# Patient Record
Sex: Female | Born: 1955 | Race: White | Hispanic: No | Marital: Married | Smoking: Never smoker
Health system: Southern US, Community
[De-identification: ages and names within clinical notes are randomized; demographics above are authoritative.]

## PROBLEM LIST (undated history)

## (undated) ENCOUNTER — Emergency Department (HOSPITAL_BASED_OUTPATIENT_CLINIC_OR_DEPARTMENT_OTHER): Discharge status: Against Medical Advice | Payer: BC Managed Care – PPO

## (undated) DIAGNOSIS — K5790 Diverticulosis of intestine, part unspecified, without perforation or abscess without bleeding: Secondary | ICD-10-CM

## (undated) DIAGNOSIS — E079 Disorder of thyroid, unspecified: Secondary | ICD-10-CM

## (undated) HISTORY — PX: ABDOMINAL HYSTERECTOMY: SHX81

## (undated) HISTORY — PX: OOPHORECTOMY: SHX86

## (undated) HISTORY — PX: APPENDECTOMY: SHX54

## (undated) HISTORY — PX: CHOLECYSTECTOMY: SHX55

---

## 2012-01-30 ENCOUNTER — Encounter: Payer: Self-pay | Admitting: *Deleted

## 2012-01-30 ENCOUNTER — Emergency Department
Admission: EM | Admit: 2012-01-30 | Discharge: 2012-01-30 | Disposition: A | Discharge status: Home / Self Care | Payer: BC Managed Care – PPO | Source: Home / Self Care | Attending: Emergency Medicine | Admitting: Emergency Medicine

## 2012-01-30 DIAGNOSIS — R21 Rash and other nonspecific skin eruption: Secondary | ICD-10-CM

## 2012-01-30 DIAGNOSIS — L299 Pruritus, unspecified: Secondary | ICD-10-CM

## 2012-01-30 HISTORY — DX: Disorder of thyroid, unspecified: E07.9

## 2012-01-30 MED ORDER — PREDNISONE 10 MG PO TABS: ORAL_TABLET | ORAL | Status: DC

## 2012-01-30 NOTE — ED Provider Notes (Signed)
History     CSN: [id number]  Arrival date & time 01/30/12  1515   First MD Initiated Contact with Patient 01/30/12 1536      Chief Complaint  Patient presents with  . Rash    (Consider location/radiation/quality/duration/timing/severity/associated sxs/prior treatment) HPI This patient complains of a RASH  Location: whole body  Onset: a few days ago    Course: unchanged Self-treated with: Benedryl topical and oral             Improvement with treatment: some  History Itching: yes  Tenderness: no  New medications/antibiotics: no, about a month ago her doctor changed her hormones and thyroid medicine  Pet exposure: no  Recent travel or tropical exposure: no  New soaps, shampoos, detergent, clothing: no  Tick/insect exposure: no   Red Flags Feeling ill: no  Fever: no  Facial/tongue swelling/difficulty breathing:  no (has a history of childhood asthma) Diabetic or immunocompromised: no     Past Medical History  Diagnosis Date  . Thyroid disease     Past Surgical History  Procedure Date  . Abdominal hysterectomy   . Appendectomy   . Cholecystectomy   . Oophorectomy     No family history on file.  History  Substance Use Topics  . Smoking status: Never Smoker   . Smokeless tobacco: Never Used  . Alcohol Use: No    OB History    Grav Para Term Preterm Abortions TAB SAB Ect Mult Living                  Review of Systems  All other systems reviewed and are negative.    Allergies  Cephalosporins; Codeine; Morphine and related; and Penicillins  Home Medications   Current Outpatient Rx  Name Route Sig Dispense Refill  . ASPIRIN 81 MG PO TABS Oral Take 81 mg by mouth daily.    Marland Kitchen CETIRIZINE HCL 10 MG PO TABS Oral Take 10 mg by mouth daily.    Marland Kitchen VITAMIN D 1000 UNITS PO TABS Oral Take 1,000 Units by mouth daily.    Marland Kitchen VITAMIN B-12 2500 MCG SL SUBL Sublingual Place under the tongue.    Marland Kitchen ESTRADIOL 1 MG PO TABS Oral Take 1 mg by mouth daily.    .  OMEGA-3 FATTY ACIDS 1000 MG PO CAPS Oral Take 2 g by mouth daily.    Marland Kitchen LEVOTHYROXINE SODIUM 112 MCG PO TABS Oral Take 112 mcg by mouth daily.    Marland Kitchen PROGESTERONE MICRONIZED PO Oral Take by mouth.    Marland Kitchen PREDNISONE 10 MG PO TABS  20mg  BID for 3 days, 10mg  BID for 3 days, then 10mg  daily for 3 days, then stop 21 tablet 0    BP 125/77  Pulse 77  Temp 98 F (36.7 C) (Oral)  Resp 20  Ht 5\' 6"  (1.676 m)  Wt 165 lb (74.844 kg)  BMI 26.63 kg/m2  SpO2 98%  Physical Exam  Nursing note and vitals reviewed. Constitutional: She is oriented to person, place, and time. She appears well-developed and well-nourished.  HENT:  Head: Normocephalic and atraumatic.       Oropharynx is patent and there is no tongue swelling  Eyes: No scleral icterus.  Neck: Neck supple.  Cardiovascular: Regular rhythm and normal heart sounds.   Pulmonary/Chest: Effort normal and breath sounds normal. No respiratory distress.  Neurological: She is alert and oriented to person, place, and time.  Skin: Skin is warm and dry.       Scattered  mild excoriations and mild erythema consistent with an atopic dermatitis.  Psychiatric: She has a normal mood and affect. Her speech is normal.    ED Course  Procedures (including critical care time)  Labs Reviewed - No data to display No results found.   1. Rash   2. Itching       MDM   This patient has a rash and some atopic dermatitis.  I gave her prescription for prednisone.  She is also to continue taking Zyrtec I also advised that she may want to take some Zantac for the next couple days to cover H2.  She is already a limited some of her over-the-counter and prescription drugs.  I told her that she'll need to advise her physician and she is doing this.  She also needs to use non-scented and hypoallergenic detergents and soaps.  She will followup with her physician this week if she is not getting better.  ER precautions for difficulty breathing or tongue swelling.  Marlaine Hind, MD 01/30/12 1556

## 2012-01-30 NOTE — ED Notes (Signed)
Patient c/o itch, rash all over body. Rash started in Sept. 2013 and itching started yesterday. Patient states she tongue is itching and feels swollen denies trouble breathing. Has tried OTC and nothing has helped changed fabric detergent to Hypoallergenic.

## 2012-04-13 ENCOUNTER — Emergency Department
Admission: EM | Admit: 2012-04-13 | Discharge: 2012-04-13 | Disposition: A | Discharge status: Home / Self Care | Payer: BC Managed Care – PPO | Source: Home / Self Care | Attending: Family Medicine | Admitting: Family Medicine

## 2012-04-13 ENCOUNTER — Encounter: Payer: Self-pay | Admitting: *Deleted

## 2012-04-13 DIAGNOSIS — H6120 Impacted cerumen, unspecified ear: Secondary | ICD-10-CM

## 2012-04-13 DIAGNOSIS — J111 Influenza due to unidentified influenza virus with other respiratory manifestations: Secondary | ICD-10-CM

## 2012-04-13 DIAGNOSIS — H6121 Impacted cerumen, right ear: Secondary | ICD-10-CM

## 2012-04-13 MED ORDER — OSELTAMIVIR PHOSPHATE 75 MG PO CAPS: 75.0000 mg | ORAL_CAPSULE | Freq: Two times a day (BID) | ORAL | Status: DC

## 2012-04-13 MED ORDER — BENZONATATE 200 MG PO CAPS: 200.0000 mg | ORAL_CAPSULE | Freq: Every day | ORAL | Status: DC

## 2012-04-13 NOTE — ED Notes (Signed)
C/o body aches, HA, congestion and drainage, sneezing x yesterday. Received flu vaccine this year.

## 2012-04-13 NOTE — ED Provider Notes (Signed)
History     CSN: [id number]  Arrival date & time 04/13/12  0944   First MD Initiated Contact with Patient 04/13/12 1006      Chief Complaint  Patient presents with  . Generalized Body Aches  . Nasal Congestion      HPI Comments: Patient complains of onset of flu-like symptoms at 3AM yesterday with sweats, myalgias, fatigue, sneezing, scratchy throat, and tightness in her anterior chest.  She has had mild nausea without vomiting. She has had influenza immunization for this season in October 2013.  She does not remember her last Tdap.   The history is provided by the patient.    Past Medical History  Diagnosis Date  . Thyroid disease     Past Surgical History  Procedure Date  . Abdominal hysterectomy   . Appendectomy   . Cholecystectomy   . Oophorectomy     Family History  Problem Relation Age of Onset  . Diabetes Mother   . Hypertension Mother   . Heart failure Mother   . Parkinson's disease Father     History  Substance Use Topics  . Smoking status: Never Smoker   . Smokeless tobacco: Never Used  . Alcohol Use: No    OB History    Grav Para Term Preterm Abortions TAB SAB Ect Mult Living                  Review of Systems + mild sore throat + cough No pleuritic pain but has anterior chest tightness No wheezing + nasal congestion + post-nasal drainage No sinus pain/pressure No itchy/red eyes ? Earache; right ear feels clogged No hemoptysis No SOB ? fever, + chills + nausea No vomiting No abdominal pain No diarrhea No urinary symptoms No skin rashes + fatigue + myalgias + headache Used OTC meds without relief  Allergies  Cephalosporins; Codeine; Morphine and related; and Penicillins  Home Medications   Current Outpatient Rx  Name  Route  Sig  Dispense  Refill  . ASPIRIN 81 MG PO TABS   Oral   Take 81 mg by mouth daily.         Marland Kitchen BENZONATATE 200 MG PO CAPS   Oral   Take 1 capsule (200 mg total) by mouth at bedtime. Take as needed  for cough   12 capsule   0   . CETIRIZINE HCL 10 MG PO TABS   Oral   Take 10 mg by mouth daily.         Marland Kitchen VITAMIN D 1000 UNITS PO TABS   Oral   Take 1,000 Units by mouth daily.         Marland Kitchen VITAMIN B-12 2500 MCG SL SUBL   Sublingual   Place under the tongue.         Marland Kitchen ESTRADIOL 1 MG PO TABS   Oral   Take 1 mg by mouth daily.         . OMEGA-3 FATTY ACIDS 1000 MG PO CAPS   Oral   Take 2 g by mouth daily.         Marland Kitchen LEVOTHYROXINE SODIUM 112 MCG PO TABS   Oral   Take 112 mcg by mouth daily.         . OSELTAMIVIR PHOSPHATE 75 MG PO CAPS   Oral   Take 1 capsule (75 mg total) by mouth every 12 (twelve) hours.   10 capsule   0   . PROGESTERONE MICRONIZED PO   Oral  Take by mouth.           BP 119/77  Pulse 81  Temp 98.6 F (37 C) (Oral)  Resp 16  Ht 5\' 6"  (1.676 m)  Wt 167 lb (75.751 kg)  BMI 26.95 kg/m2  SpO2 98%  Physical Exam Nursing notes and Vital Signs reviewed. Appearance:  Patient appears healthy, stated age, and in no acute distress Eyes:  Pupils are equal, round, and reactive to light and accomodation.  Extraocular movement is intact.  Conjunctivae are not inflamed  Ears:   Right canal occluded with cerumen; post lavage most cerumen remains but I am now able to partly visualize a normal right tympanic membrane.  Left canal and tympanic membrane normal.  Nose:  Mildly congested turbinates.  No sinus tenderness.   Pharynx:  Normal Neck:  Supple.  Slightly tender shotty anterior/posterior nodes are palpated bilaterally  Lungs:  Clear to auscultation.  Breath sounds are equal.  Chest:  Distinct tenderness to palpation over the mid-sternum.  Heart:  Regular rate and rhythm without murmurs, rubs, or gallops.  Abdomen:  Nontender without masses or hepatosplenomegaly.  Bowel sounds are present.  No CVA or flank tenderness.  Extremities:  No edema.  No calf tenderness Skin:  No rash present.   ED Course  Procedures none      1.  Influenza-like illness   2. Impacted cerumen of right ear       MDM  Begin Tamiflu.  Prescription written for Benzonatate [facility]) to take at bedtime for night-time cough. Take Mucinex D (guaifenesin with decongestant) twice daily for congestion.  Increase fluid intake, rest. May use Afrin nasal spray (or generic oxymetazoline) twice daily for about 5 days.  Also recommend using saline nasal spray several times daily and saline nasal irrigation (AYR is a common brand) Stop all antihistamines for now, and other non-prescription cough/cold preparations. May take Ibuprofen 200mg , 4 tabs every 8 hours with food for chest/sternum discomfort, body aches, etc. Recommend a Tdap when well.  Follow-up with family doctor if not improving 7 to 10 days.  Follow-up with ENT physician for removal of right ear wax.        Lattie Haw, MD 04/13/12 1059

## 2012-07-29 ENCOUNTER — Encounter: Payer: Self-pay | Admitting: Emergency Medicine

## 2012-07-29 ENCOUNTER — Emergency Department
Admission: EM | Admit: 2012-07-29 | Discharge: 2012-07-29 | Disposition: A | Discharge status: Home / Self Care | Payer: BC Managed Care – PPO | Source: Home / Self Care | Attending: Family Medicine | Admitting: Family Medicine

## 2012-07-29 DIAGNOSIS — J069 Acute upper respiratory infection, unspecified: Secondary | ICD-10-CM

## 2012-07-29 MED ORDER — AZITHROMYCIN 250 MG PO TABS: ORAL_TABLET | ORAL | Status: DC

## 2012-07-29 MED ORDER — BENZONATATE 200 MG PO CAPS: 200.0000 mg | ORAL_CAPSULE | Freq: Every day | ORAL | Status: DC

## 2012-07-29 MED ORDER — SULFAMETHOXAZOLE-TRIMETHOPRIM 800-160 MG PO TABS: 1.0000 | ORAL_TABLET | Freq: Two times a day (BID) | ORAL | Status: DC

## 2012-07-29 MED ORDER — PREDNISONE 20 MG PO TABS: 20.0000 mg | ORAL_TABLET | Freq: Two times a day (BID) | ORAL | Status: DC

## 2012-07-29 NOTE — ED Notes (Signed)
PT C/O of sinus pain, pressure, congestion. Right side gland swollen in neck rt ear pain, headache, facial pain x 5 days

## 2012-07-29 NOTE — ED Provider Notes (Signed)
History     CSN: [id number]  Arrival date & time 07/29/12  1536   First MD Initiated Contact with Patient 07/29/12 1559      Chief Complaint  Patient presents with  . URI       HPI Comments: Patient has a history of seasonal allergies, usually worse spring and fall.  Four days ago she developed fatigue and myalgias, followed by increased sinus pressure and soreness in her right neck.  No fevers, chills, and sweats.  Last night she developed a mild cough.  She has had increased congestion in her ears.  Her symptoms have not responded to her daily Zyrtec and Astepro  The history is provided by the patient.    Past Medical History  Diagnosis Date  . Thyroid disease     Past Surgical History  Procedure Laterality Date  . Abdominal hysterectomy    . Appendectomy    . Cholecystectomy    . Oophorectomy      Family History  Problem Relation Age of Onset  . Diabetes Mother   . Hypertension Mother   . Heart failure Mother   . Parkinson's disease Father     History  Substance Use Topics  . Smoking status: Never Smoker   . Smokeless tobacco: Never Used  . Alcohol Use: No    OB History   Grav Para Term Preterm Abortions TAB SAB Ect Mult Living                  Review of Systems ? sore throat + cough No pleuritic pain No wheezing + nasal congestion + post-nasal drainage + sinus pain/pressure No itchy/red eyes ? earache No hemoptysis No SOB No fever/chills No nausea No vomiting No abdominal pain No diarrhea No urinary symptoms No skin rashes + fatigue + myalgias + headache Used OTC meds without relief  Allergies  Cephalosporins; Codeine; Morphine and related; and Penicillins  Home Medications   Current Outpatient Rx  Name  Route  Sig  Dispense  Refill  . aspirin 81 MG tablet   Oral   Take 81 mg by mouth daily.         Marland Kitchen azithromycin (ZITHROMAX Z-PAK) 250 MG tablet      Take 2 tabs today; then begin one tab once daily for 4 more days. (Rx void  after 08/06/12)   6 each   0   . benzonatate (TESSALON) 200 MG capsule   Oral   Take 1 capsule (200 mg total) by mouth at bedtime. Take as needed for cough   12 capsule   0   . cetirizine (ZYRTEC) 10 MG tablet   Oral   Take 10 mg by mouth daily.         . cholecalciferol (VITAMIN D) 1000 UNITS tablet   Oral   Take 1,000 Units by mouth daily.         . Cyanocobalamin (VITAMIN B-12) 2500 MCG SUBL   Sublingual   Place under the tongue.         Marland Kitchen estradiol (ESTRACE) 1 MG tablet   Oral   Take 1 mg by mouth daily.         . fish oil-omega-3 fatty acids 1000 MG capsule   Oral   Take 2 g by mouth daily.         Marland Kitchen levothyroxine (SYNTHROID, LEVOTHROID) 112 MCG tablet   Oral   Take 112 mcg by mouth daily.         Marland Kitchen  oseltamivir (TAMIFLU) 75 MG capsule   Oral   Take 1 capsule (75 mg total) by mouth every 12 (twelve) hours.   10 capsule   0   . predniSONE (DELTASONE) 20 MG tablet   Oral   Take 1 tablet (20 mg total) by mouth 2 (two) times daily. Take with food.   10 tablet   0   . PROGESTERONE MICRONIZED PO   Oral   Take by mouth.           BP 132/77  Pulse 74  Temp(Src) 97.9 F (36.6 C) (Oral)  Ht 5\' 6"  (1.676 m)  Wt 165 lb (74.844 kg)  BMI 26.64 kg/m2  SpO2 100%  Physical Exam Nursing notes and Vital Signs reviewed. Appearance:  Patient appears healthy, stated age, and in no acute distress Eyes:  Pupils are equal, round, and reactive to light and accomodation.  Extraocular movement is intact.  Conjunctivae are not inflamed  Ears:  Canals normal.  Tympanic membranes normal.  Nose:  Mildly congested turbinates.   Mild maxillary sinus tenderness is present.  Pharynx:  Normal Neck:  Supple.  Tender enlarged right anterior node present.  Tender shotty posterior nodes are present (right worse than left) Lungs:  Clear to auscultation.  Breath sounds are equal.  Chest:  Distinct tenderness to palpation over the mid-sternum.  Heart:  Regular rate and  rhythm without murmurs, rubs, or gallops.  Abdomen:  Nontender without masses or hepatosplenomegaly.  Bowel sounds are present.  No CVA or flank tenderness.  Extremities:  No edema.  No calf tenderness Skin:  No rash present.   ED Course  Procedures  none  Labs Reviewed  STREP A DNA PROBE  POCT RAPID STREP A (OFFICE) negative      1. Acute upper respiratory infections of unspecified site; suspect viral URI       MDM  There is no evidence of bacterial infection today.   Begin prednisone burst. Prescription written for Benzonatate (Tessalon) to take at bedtime for night-time cough.  Take Mucinex D (guaifenesin with decongestant) twice daily for congestion.  Increase fluid intake, rest. May use Afrin nasal spray (or generic oxymetazoline) twice daily for about 5 days.  Also recommend using saline nasal spray several times daily and saline nasal irrigation (AYR is a common brand).  Use Astepro after Afrin and saline irrigation. Stop all antihistamines for now, and other non-prescription cough/cold preparations. Begin Azithromycin if not improving about one week or if persistent fever develops (Given a prescription to hold, with an expiration date)  Follow-up with family doctor if not improving about10 days.         Lattie Haw, MD 08/02/12 [phone]

## 2012-07-30 LAB — STREP A DNA PROBE: GASP: NEGATIVE

## 2012-08-03 ENCOUNTER — Telehealth: Payer: Self-pay | Admitting: *Deleted

## 2012-10-22 ENCOUNTER — Emergency Department (INDEPENDENT_AMBULATORY_CARE_PROVIDER_SITE_OTHER): Payer: BC Managed Care – PPO

## 2012-10-22 ENCOUNTER — Emergency Department
Admission: EM | Admit: 2012-10-22 | Discharge: 2012-10-22 | Disposition: A | Discharge status: Home / Self Care | Payer: BC Managed Care – PPO | Source: Home / Self Care | Attending: Family Medicine | Admitting: Family Medicine

## 2012-10-22 DIAGNOSIS — J3489 Other specified disorders of nose and nasal sinuses: Secondary | ICD-10-CM

## 2012-10-22 DIAGNOSIS — J309 Allergic rhinitis, unspecified: Secondary | ICD-10-CM

## 2012-10-22 DIAGNOSIS — J069 Acute upper respiratory infection, unspecified: Secondary | ICD-10-CM

## 2012-10-22 DIAGNOSIS — R05 Cough: Secondary | ICD-10-CM

## 2012-10-22 DIAGNOSIS — J3089 Other allergic rhinitis: Secondary | ICD-10-CM

## 2012-10-22 MED ORDER — BENZONATATE 200 MG PO CAPS: 200.0000 mg | ORAL_CAPSULE | Freq: Every day | ORAL | Status: DC

## 2012-10-22 MED ORDER — MOMETASONE FUROATE 50 MCG/ACT NA SUSP: NASAL | Status: DC

## 2012-10-22 MED ORDER — SULFAMETHOXAZOLE-TRIMETHOPRIM 800-160 MG PO TABS: 1.0000 | ORAL_TABLET | Freq: Two times a day (BID) | ORAL | Status: DC

## 2012-10-22 NOTE — ED Notes (Signed)
States cough and sinus problems x1 week w/out relief from OTC medications, questionable fever.

## 2012-10-22 NOTE — ED Provider Notes (Signed)
CSN: [id number]     Arrival date & time 10/22/12  4098 History     First MD Initiated Contact with Patient 10/22/12 1019     Chief Complaint  Patient presents with  . Cough  . Facial Pain     HPI Comments: Patient developed increased sinus congestion 6 days ago, followed by sore throat and a cough.  His ears feel clogged.  He had chills yesterday. He has a past history of asthma as a child, and also has had pneumonia in the past.  The history is provided by the patient.    Past Medical History  Diagnosis Date  . Thyroid disease    Past Surgical History  Procedure Laterality Date  . Abdominal hysterectomy    . Appendectomy    . Cholecystectomy    . Oophorectomy     Family History  Problem Relation Age of Onset  . Diabetes Mother   . Hypertension Mother   . Heart failure Mother   . Parkinson's disease Father    History  Substance Use Topics  . Smoking status: Never Smoker   . Smokeless tobacco: Never Used  . Alcohol Use: No   OB History   Grav Para Term Preterm Abortions TAB SAB Ect Mult Living                 Review of Systems + sore throat + cough No pleuritic pain No wheezing + nasal congestion + post-nasal drainage ? sinus pain/pressure No itchy/red eyes No earache No hemoptysis No SOB No fever, + chills No nausea No vomiting No abdominal pain No diarrhea No urinary symptoms No skin rashes + fatigue + myalgias No headache Used OTC meds without relief  Allergies  Cephalosporins; Codeine; Morphine and related; and Penicillins  Home Medications   Current Outpatient Rx  Name  Route  Sig  Dispense  Refill  . aspirin 81 MG tablet   Oral   Take 81 mg by mouth daily.         . benzonatate (TESSALON) 200 MG capsule   Oral   Take 1 capsule (200 mg total) by mouth at bedtime. Take as needed for cough   12 capsule   0   . cetirizine (ZYRTEC) 10 MG tablet   Oral   Take 10 mg by mouth daily.         . cholecalciferol (VITAMIN D) 1000  UNITS tablet   Oral   Take 1,000 Units by mouth daily.         . Cyanocobalamin (VITAMIN B-12) 2500 MCG SUBL   Sublingual   Place under the tongue.         Marland Kitchen estradiol (ESTRACE) 1 MG tablet   Oral   Take 1 mg by mouth daily.         . fish oil-omega-3 fatty acids 1000 MG capsule   Oral   Take 2 g by mouth daily.         Marland Kitchen levothyroxine (SYNTHROID, LEVOTHROID) 112 MCG tablet   Oral   Take 112 mcg by mouth daily.         . mometasone (NASONEX) 50 MCG/ACT nasal spray      Place 2 sprays in each side of nose once daily   17 g   1   . oseltamivir (TAMIFLU) 75 MG capsule   Oral   Take 1 capsule (75 mg total) by mouth every 12 (twelve) hours.   10 capsule   0   .  PROGESTERONE MICRONIZED PO   Oral   Take by mouth.         . sulfamethoxazole-trimethoprim (BACTRIM DS,SEPTRA DS) 800-160 MG per tablet   Oral   Take 1 tablet by mouth 2 (two) times daily. (Rx void after 10/29/12)   14 tablet   0    BP 109/73  Pulse 80  Temp(Src) 97.5 F (36.4 C) (Tympanic)  Ht 5\' 6"  (1.676 m)  Wt 167 lb (75.751 kg)  BMI 26.97 kg/m2  SpO2 96% Physical Exam Nursing notes and Vital Signs reviewed. Appearance:  Patient appears healthy, stated age, and in no acute distress Eyes:  Pupils are equal, round, and reactive to light and accomodation.  Extraocular movement is intact.  Conjunctivae are not inflamed  Ears:  Canals normal.  Tympanic membranes normal.  Nose:  Mildly congested turbinates.  Mild maxillary sinus tenderness is present.  Pharynx:  Normal Neck:  Supple.  Slightly tender shotty anterior/posterior nodes are palpated bilaterally, somewhat worse on the right.  Lungs:  Clear to auscultation.  Breath sounds are equal.  Heart:  Regular rate and rhythm without murmurs, rubs, or gallops.  Abdomen:  Nontender without masses or hepatosplenomegaly.  Bowel sounds are present.  No CVA or flank tenderness.  Extremities:  No edema.  No calf tenderness Skin:  No rash present.    ED Course   Procedures  none   Dg Sinuses Complete  10/22/2012   *RADIOLOGY REPORT*  Clinical Data: Cough and congestion for the past 5 days.  PARANASAL SINUSES - COMPLETE 3 + VIEW  Comparison: None.  Findings: Frontal, ethmoid, maxillary and sphenoid sinuses are clear.  No evidence of mucosal thickening, fluid or mass.  No bony abnormality.  IMPRESSION: Normal radiographs   Original Report Authenticated By: Paulina Fusi, M.D.   1. Acute upper respiratory infections of unspecified site   2. Perennial allergic rhinitis     MDM   Take Mucinex D (guaifenesin with decongestant) twice daily for congestion.  Increase fluid intake, rest. May use Afrin nasal spray (or generic oxymetazoline) twice daily for about 5 days.  Also recommend using saline nasal spray several times daily and saline nasal irrigation (AYR is a common brand).  May use Nasonex spray after using Afrin. Stop all antihistamines for now, and other non-prescription cough/cold preparations. May take Ibuprofen 200mg , 4 tabs every 8 hours with food for chest/sternum discomfort. Begin Bactrim if not improving about one week or if persistent fever develops (Given a prescription to hold, with an expiration date)  Follow-up with family doctor if not improving about10 days.   Lattie Haw, MD 10/29/12 [phone]

## 2013-02-01 ENCOUNTER — Emergency Department
Admission: EM | Admit: 2013-02-01 | Discharge: 2013-02-01 | Disposition: A | Discharge status: Home / Self Care | Payer: BC Managed Care – PPO | Source: Home / Self Care | Attending: Family Medicine | Admitting: Family Medicine

## 2013-02-01 ENCOUNTER — Encounter: Payer: Self-pay | Admitting: Emergency Medicine

## 2013-02-01 DIAGNOSIS — L03114 Cellulitis of left upper limb: Secondary | ICD-10-CM

## 2013-02-01 DIAGNOSIS — L02519 Cutaneous abscess of unspecified hand: Secondary | ICD-10-CM

## 2013-02-01 MED ORDER — DOXYCYCLINE HYCLATE 100 MG PO CAPS: 100.0000 mg | ORAL_CAPSULE | Freq: Two times a day (BID) | ORAL | Status: DC

## 2013-02-01 NOTE — ED Provider Notes (Signed)
CSN: [id number]     Arrival date & time 02/01/13  1750 History   First MD Initiated Contact with Patient 02/01/13 1833     Chief Complaint  Patient presents with  . Cellulitis    left hand     HPI Comments: Patient noticed a sore spot at the base of her left thumb 3 days ago.  The area has gradually become more painful and erythematous.  She recalls no injury, insect bite, etc.  No fevers, chills, and sweats   Patient is a 57 y.o. female presenting with abscess. The history is provided by the patient.  Abscess Location:  Hand Hand abscess location:  L hand Size:  1cm Abscess quality: induration, painful, redness and warmth   Abscess quality: not draining, no fluctuance and not weeping   Red streaking: no   Duration:  3 days Progression:  Worsening Pain details:    Quality:  Dull   Severity:  Mild   Duration:  3 days   Timing:  Constant   Progression:  Worsening Chronicity:  New Relieved by:  Nothing Exacerbated by: flexion of thumb. Ineffective treatments:  None tried Associated symptoms: no fatigue and no fever     Past Medical History  Diagnosis Date  . Thyroid disease    Past Surgical History  Procedure Laterality Date  . Abdominal hysterectomy    . Appendectomy    . Cholecystectomy    . Oophorectomy     Family History  Problem Relation Age of Onset  . Diabetes Mother   . Hypertension Mother   . Heart failure Mother   . Parkinson's disease Father    History  Substance Use Topics  . Smoking status: Never Smoker   . Smokeless tobacco: Never Used  . Alcohol Use: No   OB History   Grav Para Term Preterm Abortions TAB SAB Ect Mult Living                 Review of Systems  Constitutional: Negative for fever and fatigue.  All other systems reviewed and are negative.    Allergies  Cephalosporins; Codeine; Morphine and related; and Penicillins  Home Medications   Current Outpatient Rx  Name  Route  Sig  Dispense  Refill  . aspirin 81 MG tablet  Oral   Take 81 mg by mouth daily.         . cetirizine (ZYRTEC) 10 MG tablet   Oral   Take 10 mg by mouth daily.         . cholecalciferol (VITAMIN D) 1000 UNITS tablet   Oral   Take 1,000 Units by mouth daily.         . Cyanocobalamin (VITAMIN B-12) 2500 MCG SUBL   Sublingual   Place under the tongue.         Marland Kitchen doxycycline (VIBRAMYCIN) 100 MG capsule   Oral   Take 1 capsule (100 mg total) by mouth 2 (two) times daily.   20 capsule   0   . estradiol (ESTRACE) 1 MG tablet   Oral   Take 1 mg by mouth daily.         . fish oil-omega-3 fatty acids 1000 MG capsule   Oral   Take 2 g by mouth daily.         Marland Kitchen levothyroxine (SYNTHROID, LEVOTHROID) 112 MCG tablet   Oral   Take 112 mcg by mouth daily.         . mometasone (NASONEX) 50 MCG/ACT  nasal spray      Place 2 sprays in each side of nose once daily   17 g   1    BP 136/87  Pulse 70  Temp(Src) 98.3 F (36.8 C) (Oral)  Resp 14  Ht 5\' 6"  (1.676 m)  Wt 163 lb (73.936 kg)  BMI 26.32 kg/m2  SpO2 100% Physical Exam  Nursing note and vitals reviewed. Constitutional: She is oriented to person, place, and time. She appears well-developed and well-nourished. No distress.  HENT:  Head: Normocephalic.  Eyes: Conjunctivae are normal. Pupils are equal, round, and reactive to light.  Musculoskeletal:       Hands: Over the left thenar eminence is a 1cm dia erythematous tender indurated area with surrounding erythema as noted on diagram.  Neurological: She is alert and oriented to person, place, and time.  Skin: Skin is warm and dry.    ED Course  Procedures  none       MDM   1. Cellulitis of left hand    Begin doxycycline Begin warm soaks 3 or 4 times daily.  May take Tylenol for pain. If symptoms become significantly worse during the night or over the weekend, proceed to the local emergency room. Followup with Dr. Rodney Langton as scheduled    Lattie Haw, MD 02/01/13 [phone]

## 2013-02-01 NOTE — ED Notes (Signed)
Sharon Wade c/o sore the palm of left hand that started 3-4 days ago. Pain in hand is traveling up arm. Denies fever or chills.

## 2013-02-02 ENCOUNTER — Emergency Department (HOSPITAL_BASED_OUTPATIENT_CLINIC_OR_DEPARTMENT_OTHER): Payer: BC Managed Care – PPO

## 2013-02-02 ENCOUNTER — Emergency Department (HOSPITAL_BASED_OUTPATIENT_CLINIC_OR_DEPARTMENT_OTHER)
Admission: EM | Admit: 2013-02-02 | Discharge: 2013-02-02 | Disposition: A | Discharge status: Home / Self Care | Payer: BC Managed Care – PPO | Attending: Emergency Medicine | Admitting: Emergency Medicine

## 2013-02-02 ENCOUNTER — Telehealth: Payer: Self-pay | Admitting: Emergency Medicine

## 2013-02-02 ENCOUNTER — Encounter (HOSPITAL_BASED_OUTPATIENT_CLINIC_OR_DEPARTMENT_OTHER): Payer: Self-pay | Admitting: Emergency Medicine

## 2013-02-02 DIAGNOSIS — E079 Disorder of thyroid, unspecified: Secondary | ICD-10-CM | POA: Insufficient documentation

## 2013-02-02 DIAGNOSIS — Z792 Long term (current) use of antibiotics: Secondary | ICD-10-CM | POA: Insufficient documentation

## 2013-02-02 DIAGNOSIS — L089 Local infection of the skin and subcutaneous tissue, unspecified: Secondary | ICD-10-CM

## 2013-02-02 DIAGNOSIS — Y929 Unspecified place or not applicable: Secondary | ICD-10-CM | POA: Insufficient documentation

## 2013-02-02 DIAGNOSIS — Z7982 Long term (current) use of aspirin: Secondary | ICD-10-CM | POA: Insufficient documentation

## 2013-02-02 DIAGNOSIS — Z880 Allergy status to penicillin: Secondary | ICD-10-CM | POA: Insufficient documentation

## 2013-02-02 DIAGNOSIS — IMO0002 Reserved for concepts with insufficient information to code with codable children: Secondary | ICD-10-CM | POA: Insufficient documentation

## 2013-02-02 DIAGNOSIS — Z79899 Other long term (current) drug therapy: Secondary | ICD-10-CM | POA: Insufficient documentation

## 2013-02-02 DIAGNOSIS — Y939 Activity, unspecified: Secondary | ICD-10-CM | POA: Insufficient documentation

## 2013-02-02 DIAGNOSIS — Z8719 Personal history of other diseases of the digestive system: Secondary | ICD-10-CM | POA: Insufficient documentation

## 2013-02-02 LAB — BASIC METABOLIC PANEL WITH GFR
BUN: 15 mg/dL (ref 6–23)
CO2: 26 meq/L (ref 19–32)
Calcium: 9.7 mg/dL (ref 8.4–10.5)
Chloride: 102 meq/L (ref 96–112)
Creatinine, Ser: 0.7 mg/dL (ref 0.50–1.10)
GFR calc Af Amer: >90 mL/min
GFR calc non Af Amer: >90 mL/min
Glucose, Bld: 109 mg/dL — ABNORMAL HIGH (ref 70–99)
Potassium: 3.8 meq/L (ref 3.5–5.1)
Sodium: 138 meq/L (ref 135–145)

## 2013-02-02 LAB — CBC WITH DIFFERENTIAL/PLATELET
Eosinophils Relative: 4 % (ref 0–5)
HCT: 40.4 % (ref 36.0–46.0)
Lymphocytes Relative: 25 % (ref 12–46)
Lymphs Abs: 1.9 10*3/uL (ref 0.7–4.0)
MCV: 86.1 fL (ref 78.0–100.0)
Neutro Abs: 4.8 10*3/uL (ref 1.7–7.7)
Platelets: 227 10*3/uL (ref 150–400)
RBC: 4.69 MIL/uL (ref 3.87–5.11)
WBC: 7.5 10*3/uL (ref 4.0–10.5)

## 2013-02-02 MED ORDER — KETOROLAC TROMETHAMINE 30 MG/ML IJ SOLN
30.0000 mg | Freq: Once | INTRAMUSCULAR | Status: AC
  Administered 2013-02-02: 30 mg via INTRAVENOUS
  Filled 2013-02-02: qty 1

## 2013-02-02 MED ORDER — ACETAMINOPHEN 325 MG PO TABS
650.0000 mg | ORAL_TABLET | Freq: Once | ORAL | Status: AC
  Administered 2013-02-02: 650 mg via ORAL
  Filled 2013-02-02: qty 2

## 2013-02-02 MED ORDER — CLINDAMYCIN HCL 300 MG PO CAPS: ORAL_CAPSULE | ORAL | Status: DC

## 2013-02-02 MED ORDER — SODIUM CHLORIDE 0.9 % IV SOLN
Freq: Once | INTRAVENOUS | Status: AC
  Administered 2013-02-02: 22:00:00 via INTRAVENOUS

## 2013-02-02 MED ORDER — CLINDAMYCIN PHOSPHATE 900 MG/50ML IV SOLN
900.0000 mg | Freq: Once | INTRAVENOUS | Status: AC
Start: 2013-02-02 — End: 2013-02-02
  Administered 2013-02-02: 900 mg via INTRAVENOUS
  Filled 2013-02-02: qty 50

## 2013-02-02 NOTE — ED Notes (Signed)
Pt. Has noted red streaking up the L arm.

## 2013-02-02 NOTE — ED Provider Notes (Signed)
CSN: [id number]     Arrival date & time 02/02/13  1948 History   First MD Initiated Contact with Patient 02/02/13 2114     Chief Complaint  Patient presents with  . Insect Bite   (Consider location/radiation/quality/duration/timing/severity/associated sxs/prior Treatment) Patient is a 57 y.o. female presenting with hand pain. The history is provided by the patient. No language interpreter was used.  Hand Pain This is a new problem. Episode onset: 2. The problem occurs constantly. The problem has been gradually worsening. Nothing aggravates the symptoms. She has tried nothing for the symptoms. The treatment provided no relief.  Pt complains of a swollen area to left hand.   Pt is on doxycycline.    Past Medical History  Diagnosis Date  . Thyroid disease    Past Surgical History  Procedure Laterality Date  . Abdominal hysterectomy    . Appendectomy    . Cholecystectomy    . Oophorectomy     Family History  Problem Relation Age of Onset  . Diabetes Mother   . Hypertension Mother   . Heart failure Mother   . Parkinson's disease Father    History  Substance Use Topics  . Smoking status: Never Smoker   . Smokeless tobacco: Never Used  . Alcohol Use: No   OB History   Grav Para Term Preterm Abortions TAB SAB Ect Mult Living                 Review of Systems  Skin: Positive for wound.  All other systems reviewed and are negative.    Allergies  Cephalosporins; Codeine; Morphine and related; and Penicillins  Home Medications   Current Outpatient Rx  Name  Route  Sig  Dispense  Refill  . aspirin 81 MG tablet   Oral   Take 81 mg by mouth daily.         . cetirizine (ZYRTEC) 10 MG tablet   Oral   Take 10 mg by mouth daily.         . cholecalciferol (VITAMIN D) 1000 UNITS tablet   Oral   Take 1,000 Units by mouth daily.         . Cyanocobalamin (VITAMIN B-12) 2500 MCG SUBL   Sublingual   Place under the tongue.         Marland Kitchen doxycycline (VIBRAMYCIN) 100 MG  capsule   Oral   Take 1 capsule (100 mg total) by mouth 2 (two) times daily.   20 capsule   0   . estradiol (ESTRACE) 1 MG tablet   Oral   Take 1 mg by mouth daily.         . fish oil-omega-3 fatty acids 1000 MG capsule   Oral   Take 2 g by mouth daily.         Marland Kitchen levothyroxine (SYNTHROID, LEVOTHROID) 112 MCG tablet   Oral   Take 112 mcg by mouth daily.         . mometasone (NASONEX) 50 MCG/ACT nasal spray      Place 2 sprays in each side of nose once daily   17 g   1    BP 152/70  Pulse 79  Temp(Src) 98.5 F (36.9 C) (Oral)  Resp 16  Ht 5\' 6"  (1.676 m)  Wt 163 lb (73.936 kg)  BMI 26.32 kg/m2  SpO2 100% Physical Exam  Nursing note and vitals reviewed. Constitutional: She appears well-developed and well-nourished.  Cardiovascular: Normal rate.   Pulmonary/Chest: Effort normal.  Musculoskeletal:  She exhibits tenderness.  Streak on left arm to elbow,   Golf ball sized red area   Neurological: She is alert.  Skin: There is erythema.  Psychiatric: She has a normal mood and affect.    ED Course  INCISION AND DRAINAGE Date/Time: 02/02/2013 10:38 PM Performed by: Elson Areas Authorized by: Elson Areas Consent: Verbal consent not obtained. Risks and benefits: risks, benefits and alternatives were discussed Consent given by: patient Patient identity confirmed: verbally with patient Time out: Immediately prior to procedure a "time out" was called to verify the correct patient, procedure, equipment, support staff and site/side marked as required. Type: abscess Anesthesia: local infiltration Local anesthetic: lidocaine 2% with epinephrine Anesthetic total: 1 ml Scalpel size: 11 Incision type: single straight Drainage: purulent Drainage amount: moderate Wound treatment: wound left open Patient tolerance: Patient tolerated the procedure well with no immediate complications.   (including critical care time) Labs Review Labs Reviewed  BASIC METABOLIC  PANEL - Abnormal; Notable for the following:    Glucose, Bld 109 (*)    All other components within normal limits  CBC WITH DIFFERENTIAL   Imaging Review No results found.  EKG Interpretation   None       MDM  No diagnosis found. Pt advised to return here tomorrow for recheck.   If streaking and redness have decreased continue out pt treatment.  If increased redness or streaking.   Admit     Elson Areas, PA-C 02/02/13 2239

## 2013-02-02 NOTE — ED Notes (Signed)
Left hand(thumb) red hot swollen

## 2013-02-02 NOTE — ED Notes (Signed)
Pt. Reports she noticed the red edematous area on the L hand on Sunday and on yesterday she went to urgent care and was placed on Doxycycline.

## 2013-02-02 NOTE — ED Notes (Signed)
Patient transported to X-ray 

## 2013-02-03 ENCOUNTER — Encounter (HOSPITAL_COMMUNITY): Payer: BC Managed Care – PPO | Admitting: Anesthesiology

## 2013-02-03 ENCOUNTER — Emergency Department (HOSPITAL_BASED_OUTPATIENT_CLINIC_OR_DEPARTMENT_OTHER)
Admission: EM | Admit: 2013-02-03 | Discharge: 2013-02-03 | Disposition: A | Discharge status: Home / Self Care | Payer: BC Managed Care – PPO | Attending: Emergency Medicine | Admitting: Emergency Medicine

## 2013-02-03 ENCOUNTER — Emergency Department (HOSPITAL_COMMUNITY): Payer: BC Managed Care – PPO | Admitting: Anesthesiology

## 2013-02-03 ENCOUNTER — Encounter (HOSPITAL_COMMUNITY)
Admission: EM | Disposition: A | Discharge status: Home / Self Care | Payer: Self-pay | Source: Home / Self Care | Attending: Emergency Medicine

## 2013-02-03 ENCOUNTER — Encounter (HOSPITAL_BASED_OUTPATIENT_CLINIC_OR_DEPARTMENT_OTHER): Payer: Self-pay | Admitting: Emergency Medicine

## 2013-02-03 DIAGNOSIS — L0291 Cutaneous abscess, unspecified: Secondary | ICD-10-CM

## 2013-02-03 DIAGNOSIS — L0390 Cellulitis, unspecified: Secondary | ICD-10-CM

## 2013-02-03 DIAGNOSIS — L02519 Cutaneous abscess of unspecified hand: Secondary | ICD-10-CM | POA: Insufficient documentation

## 2013-02-03 DIAGNOSIS — L03119 Cellulitis of unspecified part of limb: Secondary | ICD-10-CM

## 2013-02-03 DIAGNOSIS — E039 Hypothyroidism, unspecified: Secondary | ICD-10-CM | POA: Insufficient documentation

## 2013-02-03 DIAGNOSIS — Z79899 Other long term (current) drug therapy: Secondary | ICD-10-CM | POA: Insufficient documentation

## 2013-02-03 HISTORY — PX: I & D EXTREMITY: SHX5045

## 2013-02-03 HISTORY — DX: Diverticulosis of intestine, part unspecified, without perforation or abscess without bleeding: K57.90

## 2013-02-03 LAB — CBC
Hemoglobin: 13.4 g/dL (ref 12.0–15.0)
MCH: 29.6 pg (ref 26.0–34.0)
MCHC: 34.5 g/dL (ref 30.0–36.0)
Platelets: 214 10*3/uL (ref 150–400)
RDW: 12.6 % (ref 11.5–15.5)

## 2013-02-03 SURGERY — IRRIGATION AND DEBRIDEMENT EXTREMITY
Anesthesia: General | Site: Hand | Laterality: Left | Wound class: Dirty or Infected

## 2013-02-03 MED ORDER — FENTANYL CITRATE 0.05 MG/ML IJ SOLN: INTRAMUSCULAR | Status: DC | PRN

## 2013-02-03 MED ORDER — VANCOMYCIN HCL 500 MG IV SOLR
INTRAVENOUS | Status: AC
  Filled 2013-02-03: qty 500

## 2013-02-03 MED ORDER — PROPOFOL 10 MG/ML IV BOLUS
INTRAVENOUS | Status: DC | PRN
  Administered 2013-02-03: 200 mg via INTRAVENOUS

## 2013-02-03 MED ORDER — LACTATED RINGERS IV SOLN
INTRAVENOUS | Status: DC
  Administered 2013-02-03: 13:00:00 via INTRAVENOUS

## 2013-02-03 MED ORDER — FENTANYL CITRATE 0.05 MG/ML IJ SOLN
INTRAMUSCULAR | Status: DC | PRN
  Administered 2013-02-03: 100 ug via INTRAVENOUS

## 2013-02-03 MED ORDER — LACTATED RINGERS IV SOLN
INTRAVENOUS | Status: DC | PRN
  Administered 2013-02-03: 13:00:00 via INTRAVENOUS

## 2013-02-03 MED ORDER — ARTIFICIAL TEARS OP OINT
TOPICAL_OINTMENT | OPHTHALMIC | Status: DC | PRN
  Administered 2013-02-03: 1 via OPHTHALMIC

## 2013-02-03 MED ORDER — ONDANSETRON HCL 4 MG/2ML IJ SOLN
INTRAMUSCULAR | Status: DC | PRN
  Administered 2013-02-03: 4 mg via INTRAVENOUS

## 2013-02-03 MED ORDER — VANCOMYCIN HCL 10 G IV SOLR
1500.0000 mg | Freq: Once | INTRAVENOUS | Status: AC
  Administered 2013-02-03: 1500 mg via INTRAVENOUS
  Filled 2013-02-03: qty 1500

## 2013-02-03 MED ORDER — BUPIVACAINE HCL (PF) 0.25 % IJ SOLN
INTRAMUSCULAR | Status: DC | PRN
  Administered 2013-02-03: 10 mL

## 2013-02-03 MED ORDER — FENTANYL CITRATE 0.05 MG/ML IJ SOLN
25.0000 ug | INTRAMUSCULAR | Status: DC | PRN
  Administered 2013-02-03 (×2): 50 ug via INTRAVENOUS

## 2013-02-03 MED ORDER — BUPIVACAINE HCL (PF) 0.25 % IJ SOLN
INTRAMUSCULAR | Status: AC
  Filled 2013-02-03: qty 30

## 2013-02-03 MED ORDER — VANCOMYCIN HCL IN DEXTROSE 750-5 MG/150ML-% IV SOLN
750.0000 mg | Freq: Two times a day (BID) | INTRAVENOUS | Status: DC
  Filled 2013-02-03: qty 150

## 2013-02-03 MED ORDER — MIDAZOLAM HCL 5 MG/5ML IJ SOLN
INTRAMUSCULAR | Status: DC | PRN
  Administered 2013-02-03 (×2): 1 mg via INTRAVENOUS

## 2013-02-03 MED ORDER — LIDOCAINE HCL (CARDIAC) 20 MG/ML IV SOLN
INTRAVENOUS | Status: DC | PRN
  Administered 2013-02-03: 50 mg via INTRAVENOUS

## 2013-02-03 MED ORDER — 0.9 % SODIUM CHLORIDE (POUR BTL) OPTIME
TOPICAL | Status: DC | PRN
  Administered 2013-02-03: 1000 mL

## 2013-02-03 MED ORDER — FENTANYL CITRATE 0.05 MG/ML IJ SOLN
INTRAMUSCULAR | Status: AC
  Filled 2013-02-03: qty 2

## 2013-02-03 MED ORDER — KETOROLAC TROMETHAMINE 30 MG/ML IJ SOLN
INTRAMUSCULAR | Status: DC | PRN
  Administered 2013-02-03: 30 mg via INTRAVENOUS

## 2013-02-03 SURGICAL SUPPLY — 40 items
BAG DECANTER FOR FLEXI CONT (MISCELLANEOUS) ×2 IMPLANT
BANDAGE ELASTIC 3 VELCRO ST LF (GAUZE/BANDAGES/DRESSINGS) ×2 IMPLANT
BANDAGE ELASTIC 4 VELCRO ST LF (GAUZE/BANDAGES/DRESSINGS) IMPLANT
BANDAGE GAUZE ELAST BULKY 4 IN (GAUZE/BANDAGES/DRESSINGS) ×2 IMPLANT
CLOTH BEACON ORANGE TIMEOUT ST (SAFETY) ×2 IMPLANT
CORDS BIPOLAR (ELECTRODE) IMPLANT
CUFF TOURNIQUET SINGLE 18IN (TOURNIQUET CUFF) IMPLANT
DRAPE SURG 17X23 STRL (DRAPES) ×2 IMPLANT
ELECT REM PT RETURN 9FT ADLT (ELECTROSURGICAL)
ELECTRODE REM PT RTRN 9FT ADLT (ELECTROSURGICAL) IMPLANT
GAUZE PACKING IODOFORM 1/4X5 (PACKING) ×2 IMPLANT
GAUZE XEROFORM 1X8 LF (GAUZE/BANDAGES/DRESSINGS) ×2 IMPLANT
GLOVE BIOGEL M STRL SZ7.5 (GLOVE) ×2 IMPLANT
GLOVE BIOGEL PI IND STRL 6.5 (GLOVE) ×2 IMPLANT
GLOVE BIOGEL PI INDICATOR 6.5 (GLOVE) ×2
GLOVE SURG SS PI 7.0 STRL IVOR (GLOVE) ×4 IMPLANT
GOWN STRL NON-REIN LRG LVL3 (GOWN DISPOSABLE) ×4 IMPLANT
GOWN STRL REIN XL XLG (GOWN DISPOSABLE) ×2 IMPLANT
HANDPIECE INTERPULSE COAX TIP (DISPOSABLE)
KIT BASIN OR (CUSTOM PROCEDURE TRAY) ×2 IMPLANT
KIT ROOM TURNOVER OR (KITS) ×2 IMPLANT
MANIFOLD NEPTUNE II (INSTRUMENTS) IMPLANT
NEEDLE HYPO 25GX1X1/2 BEV (NEEDLE) IMPLANT
NS IRRIG 1000ML POUR BTL (IV SOLUTION) ×2 IMPLANT
PACK ORTHO EXTREMITY (CUSTOM PROCEDURE TRAY) ×2 IMPLANT
PAD ARMBOARD 7.5X6 YLW CONV (MISCELLANEOUS) ×4 IMPLANT
PAD CAST 4YDX4 CTTN HI CHSV (CAST SUPPLIES) IMPLANT
PADDING CAST COTTON 4X4 STRL (CAST SUPPLIES)
SET HNDPC FAN SPRY TIP SCT (DISPOSABLE) IMPLANT
SOAP 2 % CHG 4 OZ (WOUND CARE) ×2 IMPLANT
SPONGE GAUZE 4X4 12PLY (GAUZE/BANDAGES/DRESSINGS) ×2 IMPLANT
SPONGE LAP 18X18 X RAY DECT (DISPOSABLE) IMPLANT
SPONGE LAP 4X18 X RAY DECT (DISPOSABLE) ×2 IMPLANT
SYR CONTROL 10ML LL (SYRINGE) IMPLANT
TOWEL OR 17X24 6PK STRL BLUE (TOWEL DISPOSABLE) ×2 IMPLANT
TOWEL OR 17X26 10 PK STRL BLUE (TOWEL DISPOSABLE) ×2 IMPLANT
TUBE ANAEROBIC SPECIMEN COL (MISCELLANEOUS) IMPLANT
TUBE CONNECTING 12X1/4 (SUCTIONS) ×2 IMPLANT
WATER STERILE IRR 1000ML POUR (IV SOLUTION) ×2 IMPLANT
YANKAUER SUCT BULB TIP NO VENT (SUCTIONS) ×2 IMPLANT

## 2013-02-03 NOTE — Anesthesia Preprocedure Evaluation (Addendum)
Anesthesia Evaluation  Patient identified by MRN, date of birth, ID band Patient awake    Reviewed: Allergy & Precautions, H&P , NPO status , Patient's Chart, lab work & pertinent test results, reviewed documented beta blocker date and time   Airway Mallampati: II TM Distance: >3 FB Neck ROM: Full    Dental no notable dental hx. (+) Dental Advisory Given   Pulmonary neg pulmonary ROS,  breath sounds clear to auscultation  Pulmonary exam normal       Cardiovascular negative cardio ROS  Rhythm:Regular Rate:Normal     Neuro/Psych negative neurological ROS  negative psych ROS   GI/Hepatic negative GI ROS, Neg liver ROS,   Endo/Other  Hypothyroidism   Renal/GU negative Renal ROS  negative genitourinary   Musculoskeletal   Abdominal   Peds  Hematology negative hematology ROS (+)   Anesthesia Other Findings Pt has TMJ problems, jaw clicks  Reproductive/Obstetrics negative OB ROS                        Anesthesia Physical Anesthesia Plan  ASA: II  Anesthesia Plan: General   Post-op Pain Management:    Induction: Intravenous  Airway Management Planned: LMA  Additional Equipment:   Intra-op Plan:   Post-operative Plan: Extubation in OR  Informed Consent: I have reviewed the patients History and Physical, chart, labs and discussed the procedure including the risks, benefits and alternatives for the proposed anesthesia with the patient or authorized representative who has indicated his/her understanding and acceptance.   Dental advisory given  Plan Discussed with: CRNA and Surgeon  Anesthesia Plan Comments:         Anesthesia Quick Evaluation

## 2013-02-03 NOTE — ED Provider Notes (Signed)
I saw and evaluated the patient, reviewed the resident's note and I agree with the findings and plan.  EKG Interpretation   None       56 yo female with redness of her left hand for several days.  She was started on doxycycline 2 days ago.  Presented last night to this ER where her hand was incised and drained.  She was then started on clinda.  Now, her hand swelling has increased and the redness has spread.  No fevers. On exam, well appearing, not toxic, left thenar eminence swollen, erythematous, streaking redness to wrist, beyond border of marking pen.  Will consult hand surgery.    9:09 AM Spoke with Dr. Izora Ribas who will evaluate patient in the [facility] ED.  She will be transported POV.  She is NPO.    Clinical Impression: 1. Cellulitis of hand   2. Abscess       Candyce Churn, MD 02/03/13 [phone]

## 2013-02-03 NOTE — Progress Notes (Signed)
Dr. Michelle Piper in to see pt, aware that pt has 22G IV from ED that is infusing without difficulty. Pt reports that she has been stuck 3 times for IV attempts. Dr. Michelle Piper informed this nurse no need for another IV attempt, will start in OR if needed.

## 2013-02-03 NOTE — Progress Notes (Signed)
ANTIBIOTIC CONSULT NOTE - INITIAL  Pharmacy Consult for Vancomycin Indication: Cellulitis of left hand   Allergies  Allergen Reactions  . Cephalosporins   . Codeine   . Morphine And Related   . Penicillins     Patient Measurements: Height: 5' 6.14" (168 cm) Weight: 162 lb 14.7 oz (73.9 kg) IBW/kg (Calculated) : 59.63 Adjusted Body Weight: n/a  Vital Signs: Temp: 97.7 F (36.5 C) (11/07 1004) Temp src: Oral (11/07 1004) BP: 143/75 mmHg (11/07 1004) Pulse Rate: 81 (11/07 1004) Intake/Output from previous day:   Intake/Output from this shift:    Labs:  Recent Labs  02/02/13 2208 02/03/13 0820  WBC 7.5 8.1  HGB 13.9 13.4  PLT 227 214  CREATININE 0.70  --    Estimated Creatinine Clearance: 80.9 ml/min (by C-G formula based on Cr of 0.7). No results found for this basename: VANCOTROUGH, VANCOPEAK, VANCORANDOM, GENTTROUGH, GENTPEAK, GENTRANDOM, TOBRATROUGH, TOBRAPEAK, TOBRARND, AMIKACINPEAK, AMIKACINTROU, AMIKACIN,  in the last 72 hours   Microbiology: No results found for this or any previous visit (from the past 720 hour(s)).  Medical History: Past Medical History  Diagnosis Date  . Thyroid disease   . Diverticular disease     Medications:   (Not in a hospital admission) Assessment: 57 y.o.f presents to [facility] ED again with worsening left hand cellulitis despite tx with doxycycline and clindamycin. S/p I&D on 02/02/13. WBC wnl, afebrile.   Goal of Therapy:  Vancomycin trough level 10-15 mcg/ml Eradication of infection   Plan:  1) Vancomycin IV load of 1500 mg x 1 dose ordered at [facility]  2) Start Vancomycin 750 mg Q 12 hours  3) F/u CBC, renal fx, clinical status and level as appropriate    Leith Hedlund G 02/03/2013,10:54 AM

## 2013-02-03 NOTE — Progress Notes (Signed)
Seen and agreed Herby Abraham, Pharm.D. [phone] 02/03/2013 11:19 AM

## 2013-02-03 NOTE — ED Provider Notes (Signed)
I saw and evaluated the patient, reviewed the resident's note and I agree with the findings and plan.  EKG Interpretation   None         Candyce Churn, MD 02/03/13 [phone]

## 2013-02-03 NOTE — H&P (Signed)
Reason for Consult:infection Referring Physician: [facility]  CC:My hand is infected  HPI: [name] is an 57 y.o. right handed female, that noticed small ?bite on thumb last Sunday, became more red, rec'd Doxycycline on Wed, things progressed, seen in ER yesterday, wound i&D'd, redness worse overnight.    Past Medical History  Diagnosis Date  . Thyroid disease   . Diverticular disease     Past Surgical History  Procedure Laterality Date  . Abdominal hysterectomy    . Appendectomy    . Cholecystectomy    . Oophorectomy      Family History  Problem Relation Age of Onset  . Diabetes Mother   . Hypertension Mother   . Heart failure Mother   . Parkinson's disease Father     Social History:  reports that she has never smoked. She has never used smokeless tobacco. She reports that she does not drink alcohol or use illicit drugs.  Allergies:  Allergies  Allergen Reactions  . Cephalosporins Hives  . Morphine And Related Hives  . Penicillins Hives  . Codeine Rash  . Tape Rash    Paper tape OK  Vancomycin Medications: I have reviewed the patient's current medications.  Results for orders placed during the hospital encounter of 02/03/13 (from the past 48 hour(s))  CBC     Status: None   Collection Time    02/03/13  8:20 AM      Result Value Range   WBC 8.1  4.0 - 10.5 K/uL   RBC 4.52  3.87 - 5.11 MIL/uL   Hemoglobin 13.4  12.0 - 15.0 g/dL   HCT 04.5  40.9 - 81.1 %   MCV 85.8  78.0 - 100.0 fL   MCH 29.6  26.0 - 34.0 pg   MCHC 34.5  30.0 - 36.0 g/dL   RDW 91.4  78.2 - 95.6 %   Platelets 214  150 - 400 K/uL    Dg Hand Complete Left  02/02/2013   CLINICAL DATA:  Left-sided hand pain and swelling. Most severe around the 1st metacarpal.  EXAM: LEFT HAND - COMPLETE 3+ VIEW  COMPARISON:  No priors.  FINDINGS: Multiple views of the left hand demonstrate no acute displaced fracture, subluxation, dislocation, or soft tissue abnormality.  IMPRESSION: No acute  radiographic abnormality of the left hand.   Electronically Signed   By: Trudie Reed M.D.   On: 02/02/2013 23:12    Pertinent items are noted in HPI. Temp:  [97.7 F (36.5 C)-98.5 F (36.9 C)] 97.7 F (36.5 C) (11/07 1004) Pulse Rate:  [70-81] 79 (11/07 1221) Resp:  [16-20] 20 (11/07 1221) BP: (132-152)/(62-75) 142/68 mmHg (11/07 1221) SpO2:  [99 %-100 %] 100 % (11/07 1221) Weight:  [73.9 kg (162 lb 14.7 oz)-73.936 kg (163 lb)] 73.9 kg (162 lb 14.7 oz) (11/07 1004) General appearance: alert and cooperative Resp: clear to auscultation bilaterally Cardio: regular rate and rhythm GI: soft, non-tender; bowel sounds normal; no masses,  no organomegaly Extremities: extremities normal, atraumatic, no cyanosis or edema and except for L thumb with purulent drainage , redness streaking up arm   Assessment: Infection Left thumb Plan: Explained in detail i&d, antibiotics to clear infection including risks.  [name] 02/03/2013, 2:12 PM

## 2013-02-03 NOTE — Preoperative (Signed)
Beta Blockers   Reason not to administer Beta Blockers:Not applicable 

## 2013-02-03 NOTE — ED Notes (Signed)
Pt noticed 3 whelps on her right forearm just now after vancomycin has been running since 1110. No difficulty breathing, only the whelps

## 2013-02-03 NOTE — Anesthesia Procedure Notes (Signed)
Procedure Name: LMA Insertion Date/Time: 02/03/2013 2:35 PM Performed by: Darcey Nora B Pre-anesthesia Checklist: Emergency Drugs available, Patient identified, Suction available and Patient being monitored Patient Re-evaluated:Patient Re-evaluated prior to inductionOxygen Delivery Method: Circle system utilized Preoxygenation: Pre-oxygenation with 100% oxygen Intubation Type: IV induction Ventilation: Mask ventilation without difficulty LMA: LMA inserted LMA Size: 4.0 Number of attempts: 1 Placement Confirmation: positive ETCO2 and breath sounds checked- equal and bilateral Tube secured with: taped across cheeks. Dental Injury: Teeth and Oropharynx as per pre-operative assessment

## 2013-02-03 NOTE — ED Provider Notes (Signed)
Medical screening examination/treatment/procedure(s) were performed by non-physician practitioner and as supervising physician I was immediately available for consultation/collaboration.  EKG Interpretation   None         [name], MD 02/03/13 0100 

## 2013-02-03 NOTE — ED Provider Notes (Signed)
CSN: [id number]     Arrival date & time 02/03/13  0720 History   First MD Initiated Contact with Patient 02/03/13 0734     Chief Complaint  Patient presents with  . Wound Check    left thumb   (Consider location/radiation/quality/duration/timing/severity/associated sxs/prior Treatment) HPI Comments: Patient is a 57 year old female who presents to the emergency department after being evaluated and transferred from med center [facility] with left hand cellulitis with streaking past elbow awaiting evaluation by hand surgery Dr. Izora Ribas.  She is noted to have streaking up her arm causing her elbow joint. No new complaints since being transferred.  Patient is a 57 y.o. female presenting with wound check. The history is provided by the patient and medical records.  Wound Check Associated symptoms include joint swelling.    Past Medical History  Diagnosis Date  . Thyroid disease   . Diverticular disease    Past Surgical History  Procedure Laterality Date  . Abdominal hysterectomy    . Appendectomy    . Cholecystectomy    . Oophorectomy     Family History  Problem Relation Age of Onset  . Diabetes Mother   . Hypertension Mother   . Heart failure Mother   . Parkinson's disease Father    History  Substance Use Topics  . Smoking status: Never Smoker   . Smokeless tobacco: Never Used  . Alcohol Use: No   OB History   Grav Para Term Preterm Abortions TAB SAB Ect Mult Living                 Review of Systems  Musculoskeletal: Positive for joint swelling.  Skin: Positive for color change.  All other systems reviewed and are negative.    Allergies  Cephalosporins; Codeine; Morphine and related; and Penicillins  Home Medications   Current Outpatient Rx  Name  Route  Sig  Dispense  Refill  . aspirin 81 MG tablet   Oral   Take 81 mg by mouth daily.         . cetirizine (ZYRTEC) 10 MG tablet   Oral   Take 10 mg by mouth daily.         . cholecalciferol (VITAMIN D)  1000 UNITS tablet   Oral   Take 1,000 Units by mouth daily.         . clindamycin (CLEOCIN) 300 MG capsule      One po qid   28 capsule   0   . Cyanocobalamin (VITAMIN B-12) 2500 MCG SUBL   Sublingual   Place under the tongue.         Marland Kitchen doxycycline (VIBRAMYCIN) 100 MG capsule   Oral   Take 1 capsule (100 mg total) by mouth 2 (two) times daily.   20 capsule   0   . estradiol (ESTRACE) 1 MG tablet   Oral   Take 1 mg by mouth daily.         . fish oil-omega-3 fatty acids 1000 MG capsule   Oral   Take 2 g by mouth daily.         Marland Kitchen levothyroxine (SYNTHROID, LEVOTHROID) 112 MCG tablet   Oral   Take 112 mcg by mouth daily.         . mometasone (NASONEX) 50 MCG/ACT nasal spray      Place 2 sprays in each side of nose once daily   17 g   1    BP 143/75  Pulse 81  Temp(Src)  97.7 F (36.5 C) (Oral)  Resp 16  SpO2 99% Physical Exam  Nursing note and vitals reviewed. Constitutional: She is oriented to person, place, and time. She appears well-developed and well-nourished. No distress.  HENT:  Head: Normocephalic and atraumatic.  Mouth/Throat: Oropharynx is clear and moist.  Eyes: Conjunctivae are normal.  Neck: Normal range of motion. Neck supple.  Cardiovascular: Normal rate, regular rhythm and normal heart sounds.   Capillary refill < 3 seconds.  Pulmonary/Chest: Effort normal and breath sounds normal.  Musculoskeletal:  Left hand wrapped from initial provider, streaking noted on anterior forearm past elbow fold.  Neurological: She is alert and oriented to person, place, and time.  Skin: Skin is warm and dry. She is not diaphoretic.  Psychiatric: She has a normal mood and affect. Her behavior is normal.    ED Course  Procedures (including critical care time) Labs Review Labs Reviewed  CBC   Imaging Review Dg Hand Complete Left  02/02/2013   CLINICAL DATA:  Left-sided hand pain and swelling. Most severe around the 1st metacarpal.  EXAM: LEFT HAND -  COMPLETE 3+ VIEW  COMPARISON:  No priors.  FINDINGS: Multiple views of the left hand demonstrate no acute displaced fracture, subluxation, dislocation, or soft tissue abnormality.  IMPRESSION: No acute radiographic abnormality of the left hand.   Electronically Signed   By: Trudie Reed M.D.   On: 02/02/2013 23:12    EKG Interpretation   None       MDM   1. Cellulitis of hand   2. Abscess    Patient is well appearing, VSS, NAD. Awaiting evaluation from Dr. Izora Ribas to be taken to OR.  Trevor Mace, PA-C 02/04/13 2111

## 2013-02-03 NOTE — Anesthesia Postprocedure Evaluation (Signed)
Anesthesia Post Note  Patient: Sharon Wade  Procedure(s) Performed: Procedure(s) (LRB): IRRIGATION AND DEBRIDEMENT EXTREMITY (Left)  Anesthesia type: general  Patient location: PACU  Post pain: Pain level controlled  Post assessment: Patient's Cardiovascular Status Stable  Last Vitals:  Filed Vitals:   02/03/13 1638  BP: 117/68  Pulse: 94  Temp:   Resp: 15    Post vital signs: Reviewed and stable  Level of consciousness: sedated  Complications: No apparent anesthesia complications

## 2013-02-03 NOTE — Transfer of Care (Signed)
Immediate Anesthesia Transfer of Care Note  Patient: Sharon Wade  Procedure(s) Performed: Procedure(s): IRRIGATION AND DEBRIDEMENT EXTREMITY (Left)  Patient Location: PACU  Anesthesia Type:General  Level of Consciousness: awake, alert , oriented and patient cooperative  Airway & Oxygen Therapy: Patient Spontanous Breathing and Patient connected to nasal cannula oxygen  Post-op Assessment: Report given to PACU RN, Post -op Vital signs reviewed and stable and Patient moving all extremities  Post vital signs: Reviewed and stable  Complications: No apparent anesthesia complications

## 2013-02-03 NOTE — ED Provider Notes (Signed)
CSN: [id number]     Arrival date & time 02/03/13  0720 History   First MD Initiated Contact with Patient 02/03/13 0734     Chief Complaint  Patient presents with  . Wound Check    left thumb   (Consider location/radiation/quality/duration/timing/severity/associated sxs/prior Treatment) Patient is a 57 y.o. female presenting with wound check.  Wound Check Pertinent negatives include no abdominal pain, chest pain, chills, fever or headaches.    57 y/o female here with ongoing L hand swelling due to an abscess vs cellulitis. She has been seen twice for this in the last 2 days, the last being last night. She states that overnight it has become more swollen, painful, red, and that her streaking has increased. It began on Sunday with a sore spot on the base of her L thumb. She denies any injury, bug bite, or lesion that she can recall. She denies fevers, chills, sweats, dyspnea, and pain in any other location. She had some mild transient nausea last night. She was initially managed with doxycycline and changed to clindamycin last night taking her second dose this am.   Past Medical History  Diagnosis Date  . Thyroid disease   . Diverticular disease    Past Surgical History  Procedure Laterality Date  . Abdominal hysterectomy    . Appendectomy    . Cholecystectomy    . Oophorectomy     Family History  Problem Relation Age of Onset  . Diabetes Mother   . Hypertension Mother   . Heart failure Mother   . Parkinson's disease Father    History  Substance Use Topics  . Smoking status: Never Smoker   . Smokeless tobacco: Never Used  . Alcohol Use: No   OB History   Grav Para Term Preterm Abortions TAB SAB Ect Mult Living                 Review of Systems  Constitutional: Negative for fever, chills and appetite change.  Respiratory: Negative for shortness of breath.   Cardiovascular: Negative for chest pain.  Gastrointestinal: Negative for abdominal pain.  Neurological: Negative  for headaches.  All other systems reviewed and are negative.    Allergies  Cephalosporins; Codeine; Morphine and related; and Penicillins  Home Medications   Current Outpatient Rx  Name  Route  Sig  Dispense  Refill  . aspirin 81 MG tablet   Oral   Take 81 mg by mouth daily.         . cetirizine (ZYRTEC) 10 MG tablet   Oral   Take 10 mg by mouth daily.         . cholecalciferol (VITAMIN D) 1000 UNITS tablet   Oral   Take 1,000 Units by mouth daily.         . clindamycin (CLEOCIN) 300 MG capsule      One po qid   28 capsule   0   . Cyanocobalamin (VITAMIN B-12) 2500 MCG SUBL   Sublingual   Place under the tongue.         Marland Kitchen doxycycline (VIBRAMYCIN) 100 MG capsule   Oral   Take 1 capsule (100 mg total) by mouth 2 (two) times daily.   20 capsule   0   . estradiol (ESTRACE) 1 MG tablet   Oral   Take 1 mg by mouth daily.         . fish oil-omega-3 fatty acids 1000 MG capsule   Oral   Take 2 g  by mouth daily.         Marland Kitchen levothyroxine (SYNTHROID, LEVOTHROID) 112 MCG tablet   Oral   Take 112 mcg by mouth daily.         . mometasone (NASONEX) 50 MCG/ACT nasal spray      Place 2 sprays in each side of nose once daily   17 g   1    BP 132/68  Temp(Src) 98.3 F (36.8 C) (Oral)  Resp 20  SpO2 100% Physical Exam  Constitutional: She is oriented to person, place, and time. She appears well-developed and well-nourished. No distress.  HENT:  Head: Normocephalic and atraumatic.  Eyes: EOM are normal. Pupils are equal, round, and reactive to light.  Neck: Neck supple.  Cardiovascular: Normal rate, regular rhythm and normal heart sounds.   No murmur heard. Pulmonary/Chest: Effort normal and breath sounds normal.  Abdominal: Soft. Bowel sounds are normal. There is no tenderness. There is no guarding.  Musculoskeletal:       Left wrist: She exhibits decreased range of motion, tenderness and swelling.  L hand with erythema ans swelling centered around  thenar eminence and extending down to wrist Streaking to antecubital fossa.   Neurological: She is alert and oriented to person, place, and time.  Skin: Skin is warm and dry. She is not diaphoretic.  Psychiatric: She has a normal mood and affect.    ED Course  Procedures (including critical care time) Labs Review Labs Reviewed  CBC  BASIC METABOLIC PANEL   Imaging Review Dg Hand Complete Left  02/02/2013   CLINICAL DATA:  Left-sided hand pain and swelling. Most severe around the 1st metacarpal.  EXAM: LEFT HAND - COMPLETE 3+ VIEW  COMPARISON:  No priors.  FINDINGS: Multiple views of the left hand demonstrate no acute displaced fracture, subluxation, dislocation, or soft tissue abnormality.  IMPRESSION: No acute radiographic abnormality of the left hand.   Electronically Signed   By: Trudie Reed M.D.   On: 02/02/2013 23:12    EKG Interpretation   None       MDM   1. Cellulitis of hand    L hand cellulitis suspicious for abcess s/p I&D day 1. HA now been on antibiotics (doxy for 24 hours, clind for last 12 hours) for 2 days and swelling, pain, erythema, and streaking continue to spread. Will transition to IV vanc and consult hand surgery to eval and treat as they deem necessary.  Non-toxic appearing  Case discussed with hand surgery, patient will be transferred to [facility] ED for eval.   Murtis Sink, MD [facility] Family Medicine Resident, PGY-2 02/03/2013, 8:52 AM       Elenora Gamma, MD 02/03/13 [phone]

## 2013-02-03 NOTE — ED Notes (Signed)
Patient states she developed a red raised bump on her left thumb five days ago.  States she was seen yesterday and told to come back this afternoon for a recheck.  States she was started on Doxycycline two days ago.  States this morning the swelling is increasing in her wrist.

## 2013-02-03 NOTE — ED Notes (Signed)
Dr. Izora Ribas made aware of reaction to Vancomycin. No further orders received at this time. Only asked to inform OR he would be her shortly to do H & P.

## 2013-02-04 NOTE — Op Note (Signed)
Sharon Wade, Sharon Wade           ACCOUNT NO.:  [id number]  MEDICAL RECORD NO.:  [id number]  LOCATION:  [facility]                         FACILITY:  [facility]  PHYSICIAN:  Johnette Abraham, MD    DATE OF BIRTH:  [birth year]  DATE OF PROCEDURE:  02/03/2013 DATE OF DISCHARGE:  02/03/2013                              OPERATIVE REPORT   PREOPERATIVE DIAGNOSIS:  Abscess to the left thumb thenar area.  POSTOPERATIVE DIAGNOSIS:  Abscess to the left thumb thenar area.  PROCEDURE:  Incision and drainage of deep abscess of the left thumb thenar region.  ANESTHESIA:  General.  ESTIMATED BLOOD LOSS:  Minimal.  SPECIMENS:  No specimens.  CULTURES:  No cultures taken.  INDICATIONS:  Sharon Wade is a pleasant female who was questionable bitten by insect almost a week ago.  She presented to her primary care physician, was placed on antibiotics and this worsened.  She recently presented to the ER department and placed on antibiotics.  Subsequently, her hand has become worse with red streaks up her arm.  I was consulted for definitive care.  On exam, she had evidence of abscess formation in thenar area of the thumb with questionable early tenosynovitis with streaking lymphangitis up the arm.  Risks, benefits, and alternatives of I and D, were thoroughly discussed with her and her significant other. Consent was obtained.  DESCRIPTION OF PROCEDURE:  The patient was taken to the operating room, placed supine on the operating room table.  General anesthesia was administered without difficulty.  The left hand was prepped and draped in normal sterile fashion with chlorhexidine solution.  An incision over the previous small puncture site was then opened.  There was some purulence that was expressed.  The cavity was probed.  There was a deep cavity overlying the thenar space for approximately 1.5 to 2 cm in diameter.  This was opened completely and the wound was thoroughly irrigated with irrigation  solution.  Afterwards, direct pressure was used to control hemostasis.  The wound was packed with quarter-inch iodoform gauze and a sterile dressing was applied.  The patient tolerated procedure well, was taken to recovery room in stable condition.    Johnette Abraham, MD    HCC/MEDQ  D:  02/03/2013  T:  02/04/2013  Job:  [id number]

## 2013-02-05 LAB — WOUND CULTURE

## 2013-02-06 ENCOUNTER — Ambulatory Visit (INDEPENDENT_AMBULATORY_CARE_PROVIDER_SITE_OTHER): Payer: BC Managed Care – PPO | Admitting: Sports Medicine

## 2013-02-06 ENCOUNTER — Encounter: Payer: Self-pay | Admitting: Sports Medicine

## 2013-02-06 VITALS — BP 121/76 | HR 82 | Wt 162.0 lb

## 2013-02-06 DIAGNOSIS — E785 Hyperlipidemia, unspecified: Secondary | ICD-10-CM

## 2013-02-06 DIAGNOSIS — L02519 Cutaneous abscess of unspecified hand: Secondary | ICD-10-CM

## 2013-02-06 DIAGNOSIS — L03114 Cellulitis of left upper limb: Secondary | ICD-10-CM | POA: Insufficient documentation

## 2013-02-06 DIAGNOSIS — E039 Hypothyroidism, unspecified: Secondary | ICD-10-CM

## 2013-02-06 DIAGNOSIS — Z299 Encounter for prophylactic measures, unspecified: Secondary | ICD-10-CM | POA: Insufficient documentation

## 2013-02-06 NOTE — ED Provider Notes (Signed)
Medical screening examination/treatment/procedure(s) were performed by non-physician practitioner and as supervising physician I was immediately available for consultation/collaboration.  EKG Interpretation     Ventricular Rate:    PR Interval:    QRS Duration:   QT Interval:    QTC Calculation:   R Axis:     Text Interpretation:                Junius Argyle, MD 02/06/13 1456

## 2013-02-06 NOTE — Assessment & Plan Note (Signed)
Currently managed by Dr. Angelena Sole.

## 2013-02-06 NOTE — Assessment & Plan Note (Addendum)
Status post hysterectomy, no cervical cancer screening needed. Up-to-date on colonoscopy 4 years ago, up-to-date on mammogram, up-to-date on flu shot 2 months ago. Needs tetanus shot, given today.

## 2013-02-06 NOTE — Assessment & Plan Note (Signed)
Status post incision and drainage of the left thenar eminence abscess. She has a visit with her surgeon coming up tomorrow.

## 2013-02-06 NOTE — Assessment & Plan Note (Signed)
Elevated currently doing lifestyle modifications. We can recheck this in 2 months. She does desire to avoid pharmacologic intervention, if still elevated certainly we could consider red rice yeast extract.

## 2013-02-06 NOTE — Progress Notes (Signed)
  Subjective:    CC: Establish care.   HPI:  Thumb cellulitis: Status post incision and drainage in the operating room by a hand surgeon. Currently on doxycycline, continues to improve. It is mild, improving, localized at the thenar eminence, does not radiate.  Hypothyroidism: On levothyroxine, managed by Dr. Angelena Sole at [facility] integrative medicine.  Hyperlipidemia: Does not remember her exact numbers, but does desire to try lifestyle modification for the next couple of months.  Preventive measure: Status post hysterectomy, no cervical cancer screening needed.  Up-to-date on colonoscopy 4 years ago, up-to-date on mammogram, up-to-date on flu shot 2 months ago.  Needs tetanus shot.  Past medical history, Surgical history, Family history not pertinant except as noted below, Social history, Allergies, and medications have been entered into the medical record, reviewed, and no changes needed.   Review of Systems: No headache, visual changes, nausea, vomiting, diarrhea, constipation, dizziness, abdominal pain, skin rash, fevers, chills, night sweats, swollen lymph nodes, weight loss, chest pain, body aches, joint swelling, muscle aches, shortness of breath, mood changes, visual or auditory hallucinations.  Objective:    General: Well Developed, well nourished, and in no acute distress.  Neuro: Alert and oriented x3, extra-ocular muscles intact, sensation grossly intact.  HEENT: Normocephalic, atraumatic, pupils equal round reactive to light, neck supple, no masses, no lymphadenopathy, thyroid nonpalpable.  Skin: Warm and dry, no rashes noted.  Cardiac: Regular rate and rhythm, no murmurs rubs or gallops.  Respiratory: Clear to auscultation bilaterally. Not using accessory muscles, speaking in full sentences.  Abdominal: Soft, nontender, nondistended, positive bowel sounds, no masses, no organomegaly.  Left hand: There is some swelling and a visible incision of the thenar eminence,  erythema is minimal, is still mildly tender to palpation. Neurovascularly intact distally. This was redressed with iodoform.  Impression and Recommendations:    The patient was counselled, risk factors were discussed, anticipatory guidance given.

## 2013-02-08 NOTE — ED Notes (Signed)
Post ED Visit - Positive Culture Follow-up  Culture report reviewed by antimicrobial stewardship pharmacist: []  Wes Dulaney, Pharm.D., BCPS [x]  Celedonio Miyamoto, Pharm.D., BCPS []  Georgina Pillion, Pharm.D., BCPS []  [place], [address].D., BCPS, AAHIVP []  Estella Husk, Pharm.D., BCPS, AAHIVP  Positive wound culture Treated with Clindamycin, organism sensitive to the same and no further patient follow-up is required at this time.  Kylie A Holland 02/08/2013, 8:08 AM

## 2013-04-10 ENCOUNTER — Ambulatory Visit: Payer: BC Managed Care – PPO | Admitting: Sports Medicine

## 2013-04-10 ENCOUNTER — Ambulatory Visit (INDEPENDENT_AMBULATORY_CARE_PROVIDER_SITE_OTHER): Payer: BC Managed Care – PPO | Admitting: Sports Medicine

## 2013-04-10 ENCOUNTER — Encounter: Payer: Self-pay | Admitting: Sports Medicine

## 2013-04-10 VITALS — BP 114/73 | HR 80 | Wt 161.0 lb

## 2013-04-10 DIAGNOSIS — E039 Hypothyroidism, unspecified: Secondary | ICD-10-CM

## 2013-04-10 DIAGNOSIS — E785 Hyperlipidemia, unspecified: Secondary | ICD-10-CM

## 2013-04-10 DIAGNOSIS — J329 Chronic sinusitis, unspecified: Secondary | ICD-10-CM | POA: Insufficient documentation

## 2013-04-10 MED ORDER — FLUTICASONE PROPIONATE 50 MCG/ACT NA SUSP: NASAL | Status: DC

## 2013-04-10 NOTE — Assessment & Plan Note (Signed)
Recently had thyroid medication increased. Checking T3-T4 and TSH.

## 2013-04-10 NOTE — Assessment & Plan Note (Signed)
Currently taking red rice yeast extract. Rechecking lipids, she does desire to continue nonpharmacologic methods of treatment.

## 2013-04-10 NOTE — Assessment & Plan Note (Signed)
Symptoms have only been present for one day, no antibiotics will be given. I am going to add Flonase for use around-the-clock. She is going on a trip this weekend, she will come to see me Thursday or Friday if still symptomatic.

## 2013-04-10 NOTE — Progress Notes (Signed)
  Subjective:    CC: Follow up  HPI: Hyperlipidemia: Has been doing dietary modification and is due for a recheck. She has been very resistant in the past to pharmacologic intervention.  Hypothyroidism: Currently managed by Dr. Angelena SoleWeston Saunders.  Sinusitis: Pain is localized over the frontal and maxillary sinuses, mild, duration is one day, her husband has similar symptoms. The symptoms are stuffy nose and sinus pressure. Pain has no radiation.  Past medical history, Surgical history, Family history not pertinant except as noted below, Social history, Allergies, and medications have been entered into the medical record, reviewed, and no changes needed.   Review of Systems: No fevers, chills, night sweats, weight loss, chest pain, or shortness of breath.   Objective:    General: Well Developed, well nourished, and in no acute distress.  Neuro: Alert and oriented x3, extra-ocular muscles intact, sensation grossly intact.  HEENT: Normocephalic, atraumatic, pupils equal round reactive to light, neck supple, no masses, no lymphadenopathy, thyroid nonpalpable. Oropharynx, nasopharynx, external ear canals are unremarkable. Skin: Warm and dry, no rashes. Cardiac: Regular rate and rhythm, no murmurs rubs or gallops, no lower extremity edema.  Respiratory: Clear to auscultation bilaterally. Not using accessory muscles, speaking in full sentences.  Impression and Recommendations:

## 2013-04-11 ENCOUNTER — Encounter: Payer: Self-pay | Admitting: Physician Assistant

## 2013-04-11 ENCOUNTER — Ambulatory Visit (INDEPENDENT_AMBULATORY_CARE_PROVIDER_SITE_OTHER): Payer: BC Managed Care – PPO | Admitting: Physician Assistant

## 2013-04-11 VITALS — BP 109/59 | HR 67 | Temp 97.9°F | Wt 162.0 lb

## 2013-04-11 DIAGNOSIS — J111 Influenza due to unidentified influenza virus with other respiratory manifestations: Secondary | ICD-10-CM

## 2013-04-11 DIAGNOSIS — R69 Illness, unspecified: Secondary | ICD-10-CM

## 2013-04-11 DIAGNOSIS — R6889 Other general symptoms and signs: Secondary | ICD-10-CM

## 2013-04-11 DIAGNOSIS — J019 Acute sinusitis, unspecified: Secondary | ICD-10-CM

## 2013-04-11 LAB — LIPID PANEL
Cholesterol: 216 mg/dL — ABNORMAL HIGH (ref 0–200)
HDL: 42 mg/dL (ref >39)
LDL Cholesterol: 141 mg/dL — ABNORMAL HIGH (ref 0–99)
Total CHOL/HDL Ratio: 5.1 Ratio
Triglycerides: 163 mg/dL — ABNORMAL HIGH (ref <150)
VLDL: 33 mg/dL (ref 0–40)

## 2013-04-11 LAB — T3, FREE: T3, Free: 4 pg/mL (ref 2.3–4.2)

## 2013-04-11 LAB — T4, FREE: Free T4: 1.26 ng/dL (ref 0.80–1.80)

## 2013-04-11 LAB — LDL CHOLESTEROL, DIRECT: Direct LDL: 156 mg/dL — ABNORMAL HIGH

## 2013-04-11 LAB — TSH: TSH: 1.076 u[IU]/mL (ref 0.350–4.500)

## 2013-04-11 LAB — POCT INFLUENZA A/B
INFLUENZA A, POC: NEGATIVE
INFLUENZA B, POC: NEGATIVE

## 2013-04-11 MED ORDER — AZITHROMYCIN 250 MG PO TABS: ORAL_TABLET | ORAL | Status: DC

## 2013-04-11 NOTE — Patient Instructions (Addendum)
Flu-like illness.  Treat symptoms: Ibuprofen for muscle aches and fever.  Mucinex for congestion twice a day.  Continue using nasal spray.   If not improving by Thursday start abx.

## 2013-04-11 NOTE — Progress Notes (Signed)
   Subjective:    Patient ID: Sharon Wade, female    DOB: 1955/10/30, 58 y.o.   MRN: [id number]  HPI Pt is a 58 yo female who presents to the clinic with ST, HA, chills. Pt was seen yesterday by Dr. Karie Schwalbe and discussed ongoing sinus pressure. He gave her flonase to use daily and call if not feeling better. Pt woke up this am with chills and felt feverish. She could not sleep or stop shaking. She feels drained. Pt never checked fever. She has a dry cough and lost her voiced. She aches everywhere. Denies any SOB or wheezing. Her sinus pressure is now 9/10. Her head aches if she moves it. Not tried anything to make better. She is going on a trip this weekend and wants to feel better.     Review of Systems     Objective:   Physical Exam  Constitutional: She is oriented to person, place, and time. She appears well-developed and well-nourished.  HENT:  Head: Normocephalic and atraumatic.  Right Ear: External ear normal.  Left Ear: External ear normal.  TM's clear bilaterally.  Oropharynx erythematous without tonsilar swelling or exudate. PND present Bilateral nasal turbinates red.  Lots of tenderness over both sides and forehead of the face to touch.   Eyes: Conjunctivae are normal. Right eye exhibits no discharge. Left eye exhibits no discharge.  Neck: Normal range of motion. Neck supple.  Cardiovascular: Normal rate, regular rhythm and normal heart sounds.   Pulmonary/Chest: Effort normal and breath sounds normal. She has no wheezes.  Abdominal: Soft. Bowel sounds are normal. There is no tenderness.  Lymphadenopathy:    She has no cervical adenopathy.  Neurological: She is alert and oriented to person, place, and time.  Skin: Skin is warm and dry.  Psychiatric: She has a normal mood and affect. Her behavior is normal.          Assessment & Plan:  Influenza like illness/sinusitis- Influenza testing was negative for A and B. Discussed that symptoms are typical of  Viral infection.  Unfortunately we can only treat symptoms of virus.  Ibuprofen for muscle aches and fever.  Mucinex for congestion twice a day.  Continue using nasal spray.  Stay hydrated and rest. Wrote out of work for today and tomorrow.  Pt does have some sinusitis and going on trip this weekend. I did give zpak, due to multiple allergies, if symptoms continue to worsen to use Thursday or Friday to try. If improving suggested not to use and let run it's course.

## 2013-04-13 ENCOUNTER — Encounter: Payer: Self-pay | Admitting: Sports Medicine

## 2013-04-13 ENCOUNTER — Ambulatory Visit (INDEPENDENT_AMBULATORY_CARE_PROVIDER_SITE_OTHER): Payer: BC Managed Care – PPO | Admitting: Sports Medicine

## 2013-04-13 VITALS — BP 134/80 | HR 95 | Wt 160.0 lb

## 2013-04-13 DIAGNOSIS — E785 Hyperlipidemia, unspecified: Secondary | ICD-10-CM

## 2013-04-13 DIAGNOSIS — J329 Chronic sinusitis, unspecified: Secondary | ICD-10-CM

## 2013-04-13 MED ORDER — BENZONATATE 200 MG PO CAPS: 200.0000 mg | ORAL_CAPSULE | Freq: Three times a day (TID) | ORAL | Status: DC | PRN

## 2013-04-13 MED ORDER — PREDNISONE 50 MG PO TABS: 50.0000 mg | ORAL_TABLET | Freq: Every day | ORAL | Status: DC

## 2013-04-13 MED ORDER — HYDROCOD POLST-CHLORPHEN POLST 10-8 MG/5ML PO LQCR: 5.0000 mL | Freq: Two times a day (BID) | ORAL | Status: DC | PRN

## 2013-04-13 NOTE — Assessment & Plan Note (Addendum)
Now eventually laryngitis. Prednisone, she will take azithromycin that she already has, Tessalon Perles for cough Return as needed.

## 2013-04-13 NOTE — Assessment & Plan Note (Signed)
Still resistant to trying a cholesterol medicine. She is going to switch to a vegetarian diet, we can recheck her lipids in 3-6 months.

## 2013-04-13 NOTE — Progress Notes (Signed)
  Subjective:    CC: Follow up  HPI: Sharon Wade returns, this is the third time she is seeing us for the same problem. She was diagnosed with a sinusitis, symptoms have only been present for a day initially and so she was treated conservatively. She came back the next day with some chills, but had a negative flu test. She was given azithromycin for suspected sinusitis but never took the medicine. She returns today significantly hoarse, with occasional chills, cough, and persistent sinus pain and pressure that is moderate, persistent.  Past medical history, Surgical history, Family history not pertinant except as noted below, Social history, Allergies, and medications have been entered into the medical record, reviewed, and no changes needed.   Review of Systems: No fevers, chills, night sweats, weight loss, chest pain, or shortness of breath.   Objective:    General: Well Developed, well nourished, and in no acute distress.  Neuro: Alert and oriented x3, extra-ocular muscles intact, sensation grossly intact.  HEENT: Normocephalic, atraumatic, pupils equal round reactive to light, neck supple, no masses, no lymphadenopathy, thyroid nonpalpable. Speaks with a hoarse voice, minimally tender to palpation over the maxillary sinuses, oropharynx, nasopharynx, external ear canals are unremarkable. Skin: Warm and dry, no rashes. Cardiac: Regular rate and rhythm, no murmurs rubs or gallops, no lower extremity edema.  Respiratory: Clear to auscultation bilaterally. Not using accessory muscles, speaking in full sentences.  Impression and Recommendations:

## 2013-04-13 NOTE — Patient Instructions (Signed)
Sinusitis Sinusitis is redness, soreness, and swelling (inflammation) of the paranasal sinuses. Paranasal sinuses are air pockets within the bones of your face (beneath the eyes, the middle of the forehead, or above the eyes). In healthy paranasal sinuses, mucus is able to drain out, and air is able to circulate through them by way of your nose. However, when your paranasal sinuses are inflamed, mucus and air can become trapped. This can allow bacteria and other germs to grow and cause infection. Sinusitis can develop quickly and last only a short time (acute) or continue over a long period (chronic). Sinusitis that lasts for more than 12 weeks is considered chronic.  CAUSES  Causes of sinusitis include:  Allergies.  Structural abnormalities, such as displacement of the cartilage that separates your nostrils (deviated septum), which can decrease the air flow through your nose and sinuses and affect sinus drainage.  Functional abnormalities, such as when the small hairs (cilia) that line your sinuses and help remove mucus do not work properly or are not present. SYMPTOMS  Symptoms of acute and chronic sinusitis are the same. The primary symptoms are pain and pressure around the affected sinuses. Other symptoms include:  Upper toothache.  Earache.  Headache.  Bad breath.  Decreased sense of smell and taste.  A cough, which worsens when you are lying flat.  Fatigue.  Fever.  Thick drainage from your nose, which often is green and may contain pus (purulent).  Swelling and warmth over the affected sinuses. DIAGNOSIS  Your caregiver will perform a physical exam. During the exam, your caregiver may:  Look in your nose for signs of abnormal growths in your nostrils (nasal polyps).  Tap over the affected sinus to check for signs of infection.  View the inside of your sinuses (endoscopy) with a special imaging device with a light attached (endoscope), which is inserted into your  sinuses. If your caregiver suspects that you have chronic sinusitis, one or more of the following tests may be recommended:  Allergy tests.  Nasal culture A sample of mucus is taken from your nose and sent to a lab and screened for bacteria.  Nasal cytology A sample of mucus is taken from your nose and examined by your caregiver to determine if your sinusitis is related to an allergy. TREATMENT  Most cases of acute sinusitis are related to a viral infection and will resolve on their own within 10 days. Sometimes medicines are prescribed to help relieve symptoms (pain medicine, decongestants, nasal steroid sprays, or saline sprays).  However, for sinusitis related to a bacterial infection, your caregiver will prescribe antibiotic medicines. These are medicines that will help kill the bacteria causing the infection.  Rarely, sinusitis is caused by a fungal infection. In theses cases, your caregiver will prescribe antifungal medicine. For some cases of chronic sinusitis, surgery is needed. Generally, these are cases in which sinusitis recurs more than 3 times per year, despite other treatments. HOME CARE INSTRUCTIONS   Drink plenty of water. Water helps thin the mucus so your sinuses can drain more easily.  Use a humidifier.  Inhale steam 3 to 4 times a day (for example, sit in the bathroom with the shower running).  Apply a warm, moist washcloth to your face 3 to 4 times a day, or as directed by your caregiver.  Use saline nasal sprays to help moisten and clean your sinuses.  Take over-the-counter or prescription medicines for pain, discomfort, or fever only as directed by your caregiver. SEEK IMMEDIATE MEDICAL   CARE IF:  You have increasing pain or severe headaches.  You have nausea, vomiting, or drowsiness.  You have swelling around your face.  You have vision problems.  You have a stiff neck.  You have difficulty breathing. MAKE SURE YOU:   Understand these  instructions.  Will watch your condition.  Will get help right away if you are not doing well or get worse. Document Released: 03/16/2005 Document Revised: 06/08/2011 Document Reviewed: 03/31/2011 [employer] Patient Information 2014 [employer]   Laryngitis At the top of your windpipe is your voice box. It is the source of your voice. Inside your voice box are 2 bands of muscles called vocal cords. When you breathe, your vocal cords are relaxed and open so that air can get into the lungs. When you decide to say something, these cords come together and vibrate. The sound from these vibrations goes into your throat and comes out through your mouth as sound. Laryngitis is an inflammation of the vocal cords that causes hoarseness, cough, loss of voice, sore throat, and dry throat. Laryngitis can be temporary (acute) or long-term (chronic). Most cases of acute laryngitis improve with time.Chronic laryngitis lasts for more than 3 weeks. CAUSES Laryngitis can often be related to excessive smoking, talking, or yelling, as well as inhalation of toxic fumes and allergies. Acute laryngitis is usually caused by a viral infection, vocal strain, measles or mumps, or bacterial infections. Chronic laryngitis is usually caused by vocal cord strain, vocal cord injury, postnasal drip, growths on the vocal cords, or acid reflux. SYMPTOMS   Cough.  Sore throat.  Dry throat. RISK FACTORS  Respiratory infections.  Exposure to irritating substances, such as cigarette smoke, excessive amounts of alcohol, stomach acids, and workplace chemicals.  Voice trauma, such as vocal cord injury from shouting or speaking too loud. DIAGNOSIS  Your cargiver will perform a physical exam. During the physical exam, your caregiver will examine your throat. The most common sign of laryngitis is hoarseness. Laryngoscopy may be necessary to confirm the diagnosis of this condition. This procedure allows your caregiver to look  into the larynx. HOME CARE INSTRUCTIONS  Drink enough fluids to keep your urine clear or pale yellow.  Rest until you no longer have symptoms or as directed by your caregiver.  Breathe in moist air.  Take all medicine as directed by your caregiver.  Do not smoke.  Talk as little as possible (this includes whispering).  Write on paper instead of talking until your voice is back to normal.  Follow up with your caregiver if your condition has not improved after 10 days. SEEK MEDICAL CARE IF:   You have trouble breathing.  You cough up blood.  You have persistent fever.  You have increasing pain.  You have difficulty swallowing. MAKE SURE YOU:  Understand these instructions.  Will watch your condition.  Will get help right away if you are not doing well or get worse. Document Released: 03/16/2005 Document Revised: 06/08/2011 Document Reviewed: 05/22/2010 [employer] Patient Information 2014 [employer].  

## 2013-07-17 ENCOUNTER — Ambulatory Visit: Payer: BC Managed Care – PPO | Admitting: Sports Medicine

## 2013-08-08 ENCOUNTER — Ambulatory Visit: Payer: BC Managed Care – PPO | Admitting: Sports Medicine

## 2013-08-14 ENCOUNTER — Ambulatory Visit (INDEPENDENT_AMBULATORY_CARE_PROVIDER_SITE_OTHER): Payer: BC Managed Care – PPO | Admitting: Sports Medicine

## 2013-08-14 ENCOUNTER — Encounter: Payer: Self-pay | Admitting: Sports Medicine

## 2013-08-14 VITALS — BP 123/71 | HR 76 | Ht 66.0 in | Wt 165.0 lb

## 2013-08-14 DIAGNOSIS — E785 Hyperlipidemia, unspecified: Secondary | ICD-10-CM

## 2013-08-14 DIAGNOSIS — J329 Chronic sinusitis, unspecified: Secondary | ICD-10-CM

## 2013-08-14 DIAGNOSIS — E039 Hypothyroidism, unspecified: Secondary | ICD-10-CM

## 2013-08-14 MED ORDER — AZITHROMYCIN 250 MG PO TABS: ORAL_TABLET | ORAL | Status: DC

## 2013-08-14 NOTE — Assessment & Plan Note (Signed)
Recurrent, continue Flonase, decongestants, adding azithromycin. Return as needed for this.

## 2013-08-14 NOTE — Progress Notes (Signed)
  Subjective:    CC: Followup  HPI: Sinus infection cold for a week and a half now she has had purulent nasal discharge with pain and pressure over the maxillary sinuses with radiation to the teeth.  Hyperlipidemia: Desires I give her an additional 3 months for dietary control of her lipids.  Hypothyroidism: Desires a recheck, most recent TSH, T3, T4 were normal.  Past medical history, Surgical history, Family history not pertinant except as noted below, Social history, Allergies, and medications have been entered into the medical record, reviewed, and no changes needed.   Review of Systems: No fevers, chills, night sweats, weight loss, chest pain, or shortness of breath.   Objective:    General: Well Developed, well nourished, and in no acute distress.  Neuro: Alert and oriented x3, extra-ocular muscles intact, sensation grossly intact.  HEENT: Normocephalic, atraumatic, pupils equal round reactive to light, neck supple, no masses, no lymphadenopathy, thyroid nonpalpable. Oropharynx, nasopharynx, external ear canals are unremarkable, tender to palpation over the maxillary and frontal sinuses. Skin: Warm and dry, no rashes. Cardiac: Regular rate and rhythm, no murmurs rubs or gallops, no lower extremity edema.  Respiratory: Clear to auscultation bilaterally. Not using accessory muscles, speaking in full sentences.  Impression and Recommendations:

## 2013-08-14 NOTE — Assessment & Plan Note (Signed)
Rechecking TSH, T3, and T4. Well controlled at last check.

## 2013-08-14 NOTE — Assessment & Plan Note (Signed)
Follow with like 3 more months to try and get her cholesterol and control before we recheck and possibly start cholesterol medication.

## 2013-08-15 LAB — T4, FREE: Free T4: 1.14 ng/dL (ref 0.80–1.80)

## 2013-08-15 LAB — T3, FREE: T3, Free: 2.9 pg/mL (ref 2.3–4.2)

## 2013-08-15 LAB — TSH: TSH: 0.156 u[IU]/mL — ABNORMAL LOW (ref 0.350–4.500)

## 2013-11-30 ENCOUNTER — Ambulatory Visit: Payer: BC Managed Care – PPO | Admitting: Sports Medicine

## 2014-01-02 ENCOUNTER — Encounter: Payer: Self-pay | Admitting: Sports Medicine

## 2014-01-02 ENCOUNTER — Ambulatory Visit (INDEPENDENT_AMBULATORY_CARE_PROVIDER_SITE_OTHER): Payer: BC Managed Care – PPO | Admitting: Sports Medicine

## 2014-01-02 VITALS — BP 124/73 | HR 79 | Ht 66.0 in | Wt 167.0 lb

## 2014-01-02 DIAGNOSIS — F39 Unspecified mood [affective] disorder: Secondary | ICD-10-CM

## 2014-01-02 DIAGNOSIS — M7061 Trochanteric bursitis, right hip: Secondary | ICD-10-CM | POA: Insufficient documentation

## 2014-01-02 DIAGNOSIS — F338 Other recurrent depressive disorders: Secondary | ICD-10-CM | POA: Insufficient documentation

## 2014-01-02 DIAGNOSIS — E785 Hyperlipidemia, unspecified: Secondary | ICD-10-CM

## 2014-01-02 NOTE — Assessment & Plan Note (Signed)
Improving now since the weather has gotten better. She does not desire pharmacologic intervention, she would like to try more of an herbal approach. I have recommended Gingko Biloba and/or St. John's wort.

## 2014-01-02 NOTE — Assessment & Plan Note (Signed)
Rehabilitation exercises given. Return in one month. Formal PT versus injection if no better.

## 2014-01-02 NOTE — Assessment & Plan Note (Signed)
Patient declines use of any lipid-lowering medications due to fear of developing diabetes. Risks, benefits, alternatives explained. We will no longer check lipids.

## 2014-01-02 NOTE — Progress Notes (Signed)
  Subjective:    CC: Followup  HPI: Hyperlipidemia: Tells me she is not want to try lipid-lowering medications as she is worried it may cause diabetes.  Seasonal affective disorder: Has been somewhat down since the bad weather has been persistent over the past couple of weeks, no suicidal or homicidal ideation, desires a more conservative approach using herbal treatments.  Right hip pain: Localized over the greater trochanter, moderate, persistent, unable to sleep on the ipsilateral side. No trauma.  Past medical history, Surgical history, Family history not pertinant except as noted below, Social history, Allergies, and medications have been entered into the medical record, reviewed, and no changes needed.   Review of Systems: No fevers, chills, night sweats, weight loss, chest pain, or shortness of breath.   Objective:    General: Well Developed, well nourished, and in no acute distress.  Neuro: Alert and oriented x3, extra-ocular muscles intact, sensation grossly intact.  HEENT: Normocephalic, atraumatic, pupils equal round reactive to light, neck supple, no masses, no lymphadenopathy, thyroid nonpalpable.  Skin: Warm and dry, no rashes. Cardiac: Regular rate and rhythm, no murmurs rubs or gallops, no lower extremity edema.  Respiratory: Clear to auscultation bilaterally. Not using accessory muscles, speaking in full sentences. Right Hip: ROM IR: 60 Deg, ER: 60 Deg, Flexion: 120 Deg, Extension: 100 Deg, Abduction: 45 Deg, Adduction: 45 Deg Strength IR: 5/5, ER: 5/5, Flexion: 5/5, Extension: 5/5, Abduction: 5/5, Adduction: 5/5 Pelvic alignment unremarkable to inspection and palpation. Standing hip rotation and gait without trendelenburg / unsteadiness. Greater trochanter with tenderness to palpation. No tenderness over piriformis. No SI joint tenderness and normal minimal SI movement.  Impression and Recommendations:

## 2014-01-02 NOTE — Patient Instructions (Signed)
St. John's Wort, Hypericum perforatum oral dosage forms What is this medicine? ST. JOHN'S WORT (seynt JONZ wurt) is an herbal or dietary supplement. It is promoted to help improve depressed moods. The FDA has not approved this supplement for any medical use. This supplement may be used for other purposes; ask your health care provider or pharmacist if you have questions. This medicine may be used for other purposes; ask your health care provider or pharmacist if you have questions. What should I tell my health care provider before I take this medicine? They need to know if you have any of these conditions: -heart disease -high blood pressure -immune system problems -kidney disease -liver disease -mental problems like anxiety, depression, mania -stroke -suicidal thoughts or history of attempted suicide -taken an MAOI like Carbex, Eldepryl, Marplan, Nardil, or Parnate in last 14 days -an unusual or allergic reaction to St. John's wort, other herbs, plants, medicines, foods, dyes, or preservatives -pregnant or trying to get pregnant -breast-feeding How should I use this medicine? Take this herb by mouth with a glass of water. Follow the directions on the package labeling, or talk to your health care professional for advice. Take your medicine at regular intervals. Do not take your medicine more often than directed. Contact your pediatrician or health care professional regarding the use of this herb in children. Special care may be needed. Overdosage: If you think you have taken too much of this medicine contact a poison control center or emergency room at once. NOTE: This medicine is only for you. Do not share this medicine with others. What if I miss a dose? If you miss a dose, take it as soon as you can. If it is almost time for your next dose, take only that dose. Do not take double or extra doses. What may interact with this  medicine? -amiodarone -bosentan -cyclosporine -digoxin -female hormones, like estrogens or progestins and birth control pills -irinotecan -linezolid -MAOIs like Carbex, Eldepryl, Marplan, Nardil, and Parnate -medicines for blood pressure or heart disease -medicines for depression, anxiety, or psychotic disturbances -medicines for HIV infection -medicines for sleep during surgery -methylene blue -sirolimus -sunitinib -tacrolimus -theophylline -warfarin This list may not describe all possible interactions. Give your health care provider a list of all the medicines, herbs, non-prescription drugs, or dietary supplements you use. Also tell them if you smoke, drink alcohol, or use illegal drugs. Some items may interact with your medicine. What should I watch for while using this medicine? If you are already being treated for anxiety, depression, or other mental state use this supplement only by your doctor's direction. This supplement my interfere with your other treatments. See your doctor if your symptoms do not get better or if they get worse. This medicine can make you more sensitive to the sun. Keep out of the sun. If you cannot avoid being in the sun, wear protective clothing and use sunscreen. Do not use sun lamps or tanning beds/booths. If you are scheduled for any medical or dental procedure, tell your healthcare provider that you are taking this medicine. You may need to stop taking this supplement before the procedure. Your mouth may get dry. Chewing sugarless gum or sucking hard candy, and drinking plenty of water may help. Contact your doctor if the problem does not go away or is severe. Herbal or dietary supplements are not regulated like medicines. Rigid quality control standards are not required for dietary supplements. The purity and strength of these products can vary. The safety  and effect of this dietary supplement for a certain disease or illness is not well known. This  product is not intended to diagnose, treat, cure or prevent any disease. The Food and Drug Administration suggests the following to help consumers protect themselves: -Always read product labels and follow directions. -Natural does not mean a product is safe for humans to take. -Look for products that include USP after the ingredient name. This means that the manufacturer followed the standards of the [place] Pharmacopoeia. -Supplements made or sold by a nationally known food or drug company are more likely to be made under tight controls. You can write to the company for more information about how the product was made. What side effects may I notice from receiving this medicine? Side effects that you should report to your doctor or health care professional as soon as possible: -allergic reactions like skin rash, itching or hives, swelling of the face, lips, or tongue -anxiety, nervous, racing thoughts -breathing problems -confusion or forgetfulness -fast or irregular heartbeat -suicidal thoughts or other mood changes -unusually weak or tired Minor side effects (report to your doctor or health care professional if they continue or are bothersome): -change in sex drive or performance -difficulty sleeping -stomach upset This list may not describe all possible side effects. Call your doctor for medical advice about side effects. You may report side effects to FDA at 1-800-FDA-1088. Where should I keep my medicine? Keep out of the reach of children. Store at room temperature between 15 and 30 degrees C (59 and 86 degrees F) or as directed on the package label. Protect from moisture. Throw away any unused supplement after the expiration date. NOTE: This sheet is a summary. It may not cover all possible information. If you have questions about this medicine, talk to your doctor, pharmacist, or health care provider.  2015, Elsevier/Gold Standard. (2009-10-24 18:16:08)

## 2014-02-05 ENCOUNTER — Encounter: Payer: Self-pay | Admitting: Sports Medicine

## 2014-02-05 ENCOUNTER — Ambulatory Visit (INDEPENDENT_AMBULATORY_CARE_PROVIDER_SITE_OTHER): Payer: BC Managed Care – PPO | Admitting: Sports Medicine

## 2014-02-05 VITALS — BP 127/78 | HR 81 | Ht 66.0 in | Wt 170.0 lb

## 2014-02-05 DIAGNOSIS — M7061 Trochanteric bursitis, right hip: Secondary | ICD-10-CM | POA: Diagnosis not present

## 2014-02-05 NOTE — Assessment & Plan Note (Signed)
Resolved with home rehabilitation exercises. Return as needed.

## 2014-02-05 NOTE — Progress Notes (Signed)
  Subjective:    CC: trochanteric bursitis  HPI: Right trochanteric bursitis: After 4 weeks of home rehabilitation exercises, symptoms have essentially resolved.  Past medical history, Surgical history, Family history not pertinant except as noted below, Social history, Allergies, and medications have been entered into the medical record, reviewed, and no changes needed.   Review of Systems: No fevers, chills, night sweats, weight loss, chest pain, or shortness of breath.   Objective:    General: Well Developed, well nourished, and in no acute distress.  Neuro: Alert and oriented x3, extra-ocular muscles intact, sensation grossly intact.  HEENT: Normocephalic, atraumatic, pupils equal round reactive to light, neck supple, no masses, no lymphadenopathy, thyroid nonpalpable.  Skin: Warm and dry, no rashes. Cardiac: Regular rate and rhythm, no murmurs rubs or gallops, no lower extremity edema.  Respiratory: Clear to auscultation bilaterally. Not using accessory muscles, speaking in full sentences. rightHip: ROM IR: 60 Deg, ER: 60 Deg, Flexion: 120 Deg, Extension: 100 Deg, Abduction: 45 Deg, Adduction: 45 Deg Strength IR: 5/5, ER: 5/5, Flexion: 5/5, Extension: 5/5, Abduction: 5/5, Adduction: 5/5, excellent strength to abduction bilaterally. Pelvic alignment unremarkable to inspection and palpation. Standing hip rotation and gait without trendelenburg / unsteadiness. Greater trochanter without tenderness to palpation. No tenderness over piriformis. No SI joint tenderness and normal minimal SI movement.  Impression and Recommendations:

## 2014-03-06 ENCOUNTER — Ambulatory Visit (INDEPENDENT_AMBULATORY_CARE_PROVIDER_SITE_OTHER): Payer: BC Managed Care – PPO | Admitting: Sports Medicine

## 2014-03-06 ENCOUNTER — Encounter: Payer: Self-pay | Admitting: Sports Medicine

## 2014-03-06 VITALS — BP 114/74 | HR 78 | Temp 97.6°F | Ht 66.0 in | Wt 169.0 lb

## 2014-03-06 DIAGNOSIS — J0110 Acute frontal sinusitis, unspecified: Secondary | ICD-10-CM | POA: Insufficient documentation

## 2014-03-06 MED ORDER — AZITHROMYCIN 250 MG PO TABS: ORAL_TABLET | ORAL | Status: DC

## 2014-03-06 MED ORDER — PREDNISONE 50 MG PO TABS: 50.0000 mg | ORAL_TABLET | Freq: Every day | ORAL | Status: DC

## 2014-03-06 MED ORDER — TRIAMCINOLONE ACETONIDE 55 MCG/ACT NA AERO: 2.0000 | INHALATION_SPRAY | Freq: Every day | NASAL | Status: DC

## 2014-03-06 NOTE — Assessment & Plan Note (Signed)
With eustachian tube dysfunction. Prednisone, azithromycin, Nasacort. Return to see me if no better in a couple of weeks.

## 2014-03-06 NOTE — Progress Notes (Signed)
  Subjective:    CC: Sinus pressure  HPI: This is a pleasant 58 year old female, she comes in with a several-day history of pain and pressure behind both maxillary and frontal sinuses with nasal discharge, and a sore throat. Symptoms are moderate, persistent, she does have also some stuffiness sensation in her years.  Past medical history, Surgical history, Family history not pertinant except as noted below, Social history, Allergies, and medications have been entered into the medical record, reviewed, and no changes needed.   Review of Systems: No fevers, chills, night sweats, weight loss, chest pain, or shortness of breath.   Objective:    General: Well Developed, well nourished, and in no acute distress.  Neuro: Alert and oriented x3, extra-ocular muscles intact, sensation grossly intact.  HEENT: Normocephalic, atraumatic, pupils equal round reactive to light, neck supple, no masses, no lymphadenopathy, thyroid nonpalpable. Oropharynx, nasopharynx, external ear canals are unremarkable, tender to palpation and percussion over the maxillary and frontal sinuses. Skin: Warm and dry, no rashes. Cardiac: Regular rate and rhythm, no murmurs rubs or gallops, no lower extremity edema.  Respiratory: Clear to auscultation bilaterally. Not using accessory muscles, speaking in full sentences.  Impression and Recommendations:

## 2014-03-07 ENCOUNTER — Ambulatory Visit: Payer: BC Managed Care – PPO | Admitting: Physician Assistant

## 2014-04-23 LAB — HM MAMMOGRAPHY

## 2014-05-28 ENCOUNTER — Encounter: Payer: Self-pay | Admitting: Family Medicine

## 2014-05-28 ENCOUNTER — Ambulatory Visit (INDEPENDENT_AMBULATORY_CARE_PROVIDER_SITE_OTHER): Payer: BC Managed Care – PPO | Admitting: Family Medicine

## 2014-05-28 VITALS — BP 130/81 | HR 117 | Temp 99.0°F | Wt 168.0 lb

## 2014-05-28 DIAGNOSIS — J111 Influenza due to unidentified influenza virus with other respiratory manifestations: Secondary | ICD-10-CM

## 2014-05-28 MED ORDER — BENZONATATE 200 MG PO CAPS: 200.0000 mg | ORAL_CAPSULE | Freq: Two times a day (BID) | ORAL | Status: DC | PRN

## 2014-05-28 MED ORDER — OSELTAMIVIR PHOSPHATE 75 MG PO CAPS: 75.0000 mg | ORAL_CAPSULE | Freq: Two times a day (BID) | ORAL | Status: DC

## 2014-05-28 NOTE — Progress Notes (Signed)
CC: Sharon Wade is a 59 y.o. female is here for Office Visit   Subjective: HPI:  Fever of 102.0, body aches, nonproductive cough, chills, sore throat and facial pressure that has been present for the past 24 hours. Symptoms came on suddenly yesterday fluctuated from moderate to severe in severity. Slight improvement from Tylenol no other interventions as of yet. She's lost her appetite but has been tolerating fluids. No known sick contacts. Symptoms are present all over today and she had difficulty sleeping last night due to a feeling of just not feeling comfortable. Reports fatigue. No abdominal pain, diarrhea, constipation, flank pain, genitourinary complaints nor shortness of breath nor wheezing.   Review Of Systems Outlined In HPI  Past Medical History  Diagnosis Date  . Thyroid disease   . Diverticular disease     Past Surgical History  Procedure Laterality Date  . Abdominal hysterectomy    . Appendectomy    . Cholecystectomy    . Oophorectomy    . I&d extremity Left 02/03/2013    Procedure: IRRIGATION AND DEBRIDEMENT EXTREMITY;  Surgeon: Knute NeuHarrill Coley, MD;  Location: [facility] OR;  Service: Plastics;  Laterality: Left;   Family History  Problem Relation Age of Onset  . Diabetes Mother   . Hypertension Mother   . Heart failure Mother   . Parkinson's disease Father     History   Social History  . Marital Status: Married    Spouse Name: N/A  . Number of Children: N/A  . Years of Education: N/A   Occupational History  . Not on file.   Social History Main Topics  . Smoking status: Never Smoker   . Smokeless tobacco: Never Used  . Alcohol Use: No  . Drug Use: No  . Sexual Activity: Not on file   Other Topics Concern  . Not on file   Social History Narrative     Objective: BP 130/81 mmHg  Pulse 117  Temp(Src) 99 F (37.2 C) (Oral)  Wt 168 lb (76.204 kg)  SpO2 95%  General: Alert and Oriented, No Acute Distress but appears mildly fatigued HEENT: Pupils  equal, round, reactive to light. Conjunctivae clear.  External ears unremarkable, canals clear with intact TMs with appropriate landmarks.  Middle ear appears open without effusion. Pink inferior turbinates.  Moist mucous membranes, pharynx without inflammation nor lesions.  Neck supple without palpable lymphadenopathy nor abnormal masses. Lungs: Clear to auscultation bilaterally, no wheezing/ronchi/rales.  Comfortable work of breathing. Good air movement. Cardiac: Regular rate and rhythm. Normal S1/S2.  No murmurs, rubs, nor gallops.   Extremities: No peripheral edema.  Strong peripheral pulses.  Mental Status: No depression, anxiety, nor agitation. Skin: Warm and dry.  Assessment & Plan: [name] was seen today for office visit.  Diagnoses and all orders for this visit:  Flu Orders: -     oseltamivir (TAMIFLU) 75 MG capsule; Take 1 capsule (75 mg total) by mouth 2 (two) times daily. -     benzonatate (TESSALON) 200 MG capsule; Take 1 capsule (200 mg total) by mouth 2 (two) times daily as needed for cough.   History and unremarkable exam highly suggestive of influenza therefore start Tamiflu focus on staying well-hydrated. Ibuprofen or Tylenol to help with body aches and fever. Tessalon Perles to help with cough she declines codeine or anything stronger. Written out of work for today and tomorrow  Return if symptoms worsen or fail to improve.

## 2014-05-30 ENCOUNTER — Encounter: Payer: Self-pay | Admitting: Family Medicine

## 2014-06-04 ENCOUNTER — Encounter: Payer: Self-pay | Admitting: Physician Assistant

## 2014-06-04 ENCOUNTER — Ambulatory Visit (INDEPENDENT_AMBULATORY_CARE_PROVIDER_SITE_OTHER): Payer: BC Managed Care – PPO | Admitting: Physician Assistant

## 2014-06-04 VITALS — BP 146/75 | HR 79 | Temp 98.1°F | Ht 66.0 in | Wt 168.0 lb

## 2014-06-04 DIAGNOSIS — J0140 Acute pansinusitis, unspecified: Secondary | ICD-10-CM

## 2014-06-04 DIAGNOSIS — R591 Generalized enlarged lymph nodes: Secondary | ICD-10-CM

## 2014-06-04 DIAGNOSIS — R0989 Other specified symptoms and signs involving the circulatory and respiratory systems: Secondary | ICD-10-CM | POA: Insufficient documentation

## 2014-06-04 MED ORDER — AZITHROMYCIN 250 MG PO TABS: ORAL_TABLET | ORAL | Status: DC

## 2014-06-04 MED ORDER — METHYLPREDNISOLONE SODIUM SUCC 125 MG IJ SOLR
125.0000 mg | Freq: Once | INTRAMUSCULAR | Status: AC
Start: 2014-06-04 — End: 2014-06-04
  Administered 2014-06-04: 125 mg via INTRAMUSCULAR

## 2014-06-04 NOTE — Patient Instructions (Signed)

## 2014-06-04 NOTE — Progress Notes (Signed)
   Subjective:    Patient ID: Sharon Wade, female    DOB: 14-Oct-1955, 59 y.o.   MRN: [id number]  HPI Pt presents to the clinic with cough and sinus pressure that seems to be worsening. She was seen by Dr. Ivan AnchorsHommel 2/26 and dx with flu and started on tamiflu. She did start to feel some better but sinus pressure, ST, cough is still present. Cough is mildly productive. She has tremendous facial pain and head pressure. Her teeth ache. Not able to work today.    Review of Systems  All other systems reviewed and are negative.      Objective:   Physical Exam  Constitutional: She is oriented to person, place, and time. She appears well-developed and well-nourished.  HENT:  Head: Normocephalic and atraumatic.  Right Ear: External ear normal.  Left Ear: External ear normal.  TM's clear bilaterally,   Oropharynx erythematous no exudate.   Bilateral nasal turbinates red and swollen.   Tenderness to palpation over frontal and maxillary sinuses to palpation.   Eyes: Conjunctivae are normal. Right eye exhibits no discharge. Left eye exhibits no discharge.  Neck: Normal range of motion.  Bilateral tender and swollen lymphadenopathy.   Cardiovascular: Normal rate, regular rhythm and normal heart sounds.   Pulmonary/Chest: Effort normal and breath sounds normal. She has no wheezes.  Neurological: She is alert and oriented to person, place, and time.  Skin: Skin is dry.  Psychiatric: She has a normal mood and affect. Her behavior is normal.          Assessment & Plan:  Acute pansinusitis- treated with zpak due to PCN allergy. Solumedrol 125mg  IM given in office today. Discussed OTC treatment as well with nasocort.

## 2014-08-06 ENCOUNTER — Ambulatory Visit (INDEPENDENT_AMBULATORY_CARE_PROVIDER_SITE_OTHER): Payer: BC Managed Care – PPO | Admitting: Sports Medicine

## 2014-08-06 ENCOUNTER — Encounter: Payer: Self-pay | Admitting: Sports Medicine

## 2014-08-06 VITALS — BP 126/72 | HR 70 | Ht 66.0 in | Wt 167.0 lb

## 2014-08-06 DIAGNOSIS — E038 Other specified hypothyroidism: Secondary | ICD-10-CM

## 2014-08-06 DIAGNOSIS — E785 Hyperlipidemia, unspecified: Secondary | ICD-10-CM | POA: Diagnosis not present

## 2014-08-06 DIAGNOSIS — M7061 Trochanteric bursitis, right hip: Secondary | ICD-10-CM | POA: Diagnosis not present

## 2014-08-06 NOTE — Progress Notes (Signed)
  Subjective:    CC: six-month follow-up  HPI: Trochanteric bursitis: Resolved  Hypothyroidism: Due for a TSH.  Hyperlipidemia: Has not had lipids checked in some time.  Past medical history, Surgical history, Family history not pertinant except as noted below, Social history, Allergies, and medications have been entered into the medical record, reviewed, and no changes needed.   Review of Systems: No fevers, chills, night sweats, weight loss, chest pain, or shortness of breath.   Objective:    General: Well Developed, well nourished, and in no acute distress.  Neuro: Alert and oriented x3, extra-ocular muscles intact, sensation grossly intact.  HEENT: Normocephalic, atraumatic, pupils equal round reactive to light, neck supple, no masses, no lymphadenopathy, thyroid nonpalpable.  Skin: Warm and dry, no rashes. Cardiac: Regular rate and rhythm, no murmurs rubs or gallops, no lower extremity edema.  Respiratory: Clear to auscultation bilaterally. Not using accessory muscles, speaking in full sentences.  Impression and Recommendations:

## 2014-08-06 NOTE — Assessment & Plan Note (Signed)
Lipid panel, A1c, vitamin D.

## 2014-08-06 NOTE — Assessment & Plan Note (Signed)
Continues to be resolved.

## 2014-08-06 NOTE — Assessment & Plan Note (Signed)
Rechecking TSH 

## 2014-10-30 ENCOUNTER — Encounter: Payer: Self-pay | Admitting: *Deleted

## 2015-02-07 ENCOUNTER — Ambulatory Visit: Payer: BC Managed Care – PPO | Admitting: Sports Medicine

## 2015-02-15 ENCOUNTER — Encounter: Payer: Self-pay | Admitting: Sports Medicine

## 2015-02-15 ENCOUNTER — Ambulatory Visit (INDEPENDENT_AMBULATORY_CARE_PROVIDER_SITE_OTHER): Payer: BC Managed Care – PPO | Admitting: Sports Medicine

## 2015-02-15 VITALS — BP 133/61 | HR 79 | Ht 66.0 in | Wt 168.0 lb

## 2015-02-15 DIAGNOSIS — E038 Other specified hypothyroidism: Secondary | ICD-10-CM

## 2015-02-15 DIAGNOSIS — Z299 Encounter for prophylactic measures, unspecified: Secondary | ICD-10-CM | POA: Diagnosis not present

## 2015-02-15 DIAGNOSIS — E785 Hyperlipidemia, unspecified: Secondary | ICD-10-CM

## 2015-02-15 NOTE — Assessment & Plan Note (Signed)
Improved with dietary changes, she will drop off her test results

## 2015-02-15 NOTE — Assessment & Plan Note (Addendum)
Previously had a low TSH, currently being managed by integrative medicine

## 2015-02-15 NOTE — Progress Notes (Signed)
  Subjective:    CC: Follow-up  HPI: Hyperlipidemia: Per patient fantastic improvement, she forgot to bring her blood work results to me. She has only been doing diet and exercise.  Hypothyroidism: Currently managed with an integrative medicine physician.  Preventive measures: Up-to-date on cervical and breast cancer screening, due for colon cancer screening. She is agreeable to try the Cologuard test.  Past medical history, Surgical history, Family history not pertinant except as noted below, Social history, Allergies, and medications have been entered into the medical record, reviewed, and no changes needed.   Review of Systems: No fevers, chills, night sweats, weight loss, chest pain, or shortness of breath.   Objective:    General: Well Developed, well nourished, and in no acute distress.  Neuro: Alert and oriented x3, extra-ocular muscles intact, sensation grossly intact.  HEENT: Normocephalic, atraumatic, pupils equal round reactive to light, neck supple, no masses, no lymphadenopathy, thyroid nonpalpable.  Skin: Warm and dry, no rashes. Cardiac: Regular rate and rhythm, no murmurs rubs or gallops, no lower extremity edema.  Respiratory: Clear to auscultation bilaterally. Not using accessory muscles, speaking in full sentences.  Impression and Recommendations:    I spent 25 minutes with this patient, greater than 50% was face-to-face time counseling regarding the above diagnoses

## 2015-02-15 NOTE — Assessment & Plan Note (Signed)
Adding Cologuard. 

## 2015-03-21 LAB — COLOGUARD: COLOGUARD: NEGATIVE

## 2015-05-19 ENCOUNTER — Emergency Department
Admission: EM | Admit: 2015-05-19 | Discharge: 2015-05-19 | Disposition: A | Discharge status: Home / Self Care | Payer: BC Managed Care – PPO | Source: Home / Self Care | Attending: Family Medicine | Admitting: Family Medicine

## 2015-05-19 ENCOUNTER — Encounter: Payer: Self-pay | Admitting: Emergency Medicine

## 2015-05-19 DIAGNOSIS — R69 Illness, unspecified: Principal | ICD-10-CM

## 2015-05-19 DIAGNOSIS — J111 Influenza due to unidentified influenza virus with other respiratory manifestations: Secondary | ICD-10-CM

## 2015-05-19 LAB — POCT INFLUENZA A/B
INFLUENZA A, POC: NEGATIVE
Influenza B, POC: NEGATIVE

## 2015-05-19 MED ORDER — OSELTAMIVIR PHOSPHATE 75 MG PO CAPS: 75.0000 mg | ORAL_CAPSULE | Freq: Two times a day (BID) | ORAL | Status: DC

## 2015-05-19 MED ORDER — BENZONATATE 200 MG PO CAPS: 200.0000 mg | ORAL_CAPSULE | Freq: Every day | ORAL | Status: DC

## 2015-05-19 NOTE — ED Provider Notes (Signed)
CSN: [id number]     Arrival date & time 05/19/15  1610 History   First MD Initiated Contact with Patient 05/19/15 1836     Chief Complaint  Patient presents with  . URI      HPI Comments: Complains of 1 day history flu-like illness including myalgias, headache, fever/chills, fatigue, and cough.  Also has mild nasal congestion and sore throat.  Cough is non-productive. No pleuritic pain or shortness of breath. She has had mild nausea without vomiting.   The history is provided by the patient.    Past Medical History  Diagnosis Date  . Thyroid disease   . Diverticular disease    Past Surgical History  Procedure Laterality Date  . Abdominal hysterectomy    . Appendectomy    . Cholecystectomy    . Oophorectomy    . I&d extremity Left 02/03/2013    Procedure: IRRIGATION AND DEBRIDEMENT EXTREMITY;  Surgeon: Knute Neu, MD;  Location: [facility] OR;  Service: Plastics;  Laterality: Left;   Family History  Problem Relation Age of Onset  . Diabetes Mother   . Hypertension Mother   . Heart failure Mother   . Parkinson's disease Father    Social History  Substance Use Topics  . Smoking status: Never Smoker   . Smokeless tobacco: Never Used  . Alcohol Use: No   OB History    No data available     Review of Systems + sore throat + cough + sneezing No pleuritic pain No wheezing + nasal congestion + post-nasal drainage No sinus pain/pressure No itchy/red eyes ? earache No hemoptysis No SOB No fever, + chills/sweats + nausea No vomiting No abdominal pain No diarrhea No urinary symptoms No skin rash + fatigue + myalgias + headache Used OTC meds without relief  Allergies  Cephalosporins; Morphine and related; Penicillins; Codeine; Tape; and Vancomycin  Home Medications   Prior to Admission medications   Medication Sig Start Date End Date Taking? Authorizing Provider  benzonatate (TESSALON) 200 MG capsule Take 1 capsule (200 mg total) by mouth at bedtime. Take as  needed for cough 05/19/15   Lattie Haw, MD  cetirizine (ZYRTEC) 10 MG tablet Take 10 mg by mouth daily.    Historical Provider, MD  CINNAMON PO Take 1 tablet by mouth daily.    Historical Provider, MD  Cyanocobalamin (VITAMIN B-12) 2500 MCG SUBL Place 1 tablet under the tongue daily.     Historical Provider, MD  estrogens, conjugated, (PREMARIN) 0.625 MG tablet Take 0.625 mg by mouth daily. Take daily for 21 days then do not take for 7 days.    Historical Provider, MD  fish oil-omega-3 fatty acids 1000 MG capsule Take 2 g by mouth daily.    Historical Provider, MD  levothyroxine (SYNTHROID, LEVOTHROID) 125 MCG tablet Take 125 mcg by mouth daily before breakfast.    Historical Provider, MD  oseltamivir (TAMIFLU) 75 MG capsule Take 1 capsule (75 mg total) by mouth every 12 (twelve) hours. 05/19/15   Lattie Haw, MD   Meds Ordered and Administered this Visit  Medications - No data to display  BP 111/73 mmHg  Pulse 90  Temp(Src) 98.2 F (36.8 C) (Oral)  Ht  (1.676 m)  Wt 164 lb (74.39 kg)  BMI 26.48 kg/m2  SpO2 98% No data found.   Physical Exam Nursing notes and Vital Signs reviewed. Appearance:  Patient appears stated age, and in no acute distress Eyes:  Pupils are equal, round, and reactive to  light and accomodation.  Extraocular movement is intact.  Conjunctivae are not inflamed  Ears:  Canals normal.  Tympanic membranes normal.  Nose:  Mildly congested turbinates.  No sinus tenderness.   Pharynx:  Normal Neck:  Supple.  Tender enlarged posterior nodes are palpated bilaterally  Lungs:  Clear to auscultation.  Breath sounds are equal.  Moving air well. Chest:  Distinct tenderness to palpation over the mid-sternum.  Heart:  Regular rate and rhythm without murmurs, rubs, or gallops.  Abdomen:  Nontender without masses or hepatosplenomegaly.  Bowel sounds are present.  No CVA or flank tenderness.  Extremities:  No edema.  Skin:  No rash present.   ED Course  Procedures  none    Labs Reviewed  POCT INFLUENZA A/B negative     MDM   1. Influenza-like illness    Begin Tamiflu.  Prescription written for Benzonatate [facility]) to take at bedtime for night-time cough.  Take plain guaifenesin (  extended release tabs such as Mucinex) twice daily, with plenty of water, for cough and congestion.  May add Pseudoephedrine ( , one or two every 4 to 6 hours) for sinus congestion.  Get adequate rest.   May use Afrin nasal spray (or generic oxymetazoline) twice daily for about 5 days and then discontinue.  Also recommend using saline nasal spray several times daily and saline nasal irrigation (AYR is a common brand).  Try warm salt water gargles for sore throat.  Stop all antihistamines for now, and other non-prescription cough/cold preparations. May take Ibuprofen , 4 tabs every 8 hours with food for chest/sternum discomfort, headaches, fever, etc.   Follow-up with family doctor if not improving about 4 to 5 days.    Lattie Haw, MD 05/21/15 1300

## 2015-05-19 NOTE — ED Notes (Signed)
Pt c/o headache, body aches, body chills, chest tightness x 1 day

## 2015-05-19 NOTE — Discharge Instructions (Signed)
Take plain guaifenesin (  extended release tabs such as Mucinex) twice daily, with plenty of water, for cough and congestion.  May add Pseudoephedrine ( , one or two every 4 to 6 hours) for sinus congestion.  Get adequate rest.   May use Afrin nasal spray (or generic oxymetazoline) twice daily for about 5 days and then discontinue.  Also recommend using saline nasal spray several times daily and saline nasal irrigation (AYR is a common brand).  Try warm salt water gargles for sore throat.  Stop all antihistamines for now, and other non-prescription cough/cold preparations. May take Ibuprofen , 4 tabs every 8 hours with food for chest/sternum discomfort, headaches, fever, etc.   Follow-up with family doctor if not improving about 4 to 5 days.   Influenza, Adult Influenza ("the flu") is a viral infection of the respiratory tract. It occurs more often in winter months because people spend more time in close contact with one another. Influenza can make you feel very sick. Influenza easily spreads from person to person (contagious). CAUSES  Influenza is caused by a virus that infects the respiratory tract. You can catch the virus by breathing in droplets from an infected person's cough or sneeze. You can also catch the virus by touching something that was recently contaminated with the virus and then touching your mouth, nose, or eyes. RISKS AND COMPLICATIONS You may be at risk for a more severe case of influenza if you smoke cigarettes, have diabetes, have chronic heart disease (such as heart failure) or lung disease (such as asthma), or if you have a weakened immune system. Elderly people and pregnant women are also at risk for more serious infections. The most common problem of influenza is a lung infection (pneumonia). Sometimes, this problem can require emergency medical care and may be life threatening. SIGNS AND SYMPTOMS  Symptoms typically last 4 to 10 days and may  include:  Fever.  Chills.  Headache, body aches, and muscle aches.  Sore throat.  Chest discomfort and cough.  Poor appetite.  Weakness or feeling tired.  Dizziness.  Nausea or vomiting. DIAGNOSIS  Diagnosis of influenza is often made based on your history and a physical exam. A nose or throat swab test can be done to confirm the diagnosis. TREATMENT  In mild cases, influenza goes away on its own. Treatment is directed at relieving symptoms. For more severe cases, your health care provider may prescribe antiviral medicines to shorten the sickness. Antibiotic medicines are not effective because the infection is caused by a virus, not by bacteria. HOME CARE INSTRUCTIONS  Take medicines only as directed by your health care provider.  Use a cool mist humidifier to make breathing easier.  Get plenty of rest until your temperature returns to normal. This usually takes 3 to 4 days.  Drink enough fluid to keep your urine clear or pale yellow.  Cover yourmouth and nosewhen coughing or sneezing,and wash your handswellto prevent thevirusfrom spreading.  Stay homefromwork orschool untilthe fever is gonefor at least 28full day. PREVENTION  An annual influenza vaccination (flu shot) is the best way to avoid getting influenza. An annual flu shot is now routinely recommended for all adults in the [place] SEEK MEDICAL CARE IF:  You experiencechest pain, yourcough worsens,or you producemore mucus.  Youhave nausea,vomiting, ordiarrhea.  Your fever returns or gets worse. SEEK IMMEDIATE MEDICAL CARE IF:  You havetrouble breathing, you become short of breath,or your skin ornails becomebluish.  You have severe painor stiffnessin the neck.  You develop a  sudden headache, or pain in the face or ear.  You have nausea or vomiting that you cannot control. MAKE SURE YOU:   Understand these instructions.  Will watch your condition.  Will get help right away if you  are not doing well or get worse.   This information is not intended to replace advice given to you by your health care provider. Make sure you discuss any questions you have with your health care provider.   Document Released: 03/13/2000 Document Revised: 04/06/2014 Document Reviewed: 06/15/2011 Elsevier Interactive Patient Education [employer].

## 2016-04-08 ENCOUNTER — Encounter: Payer: Self-pay | Admitting: *Deleted

## 2016-04-08 ENCOUNTER — Emergency Department
Admission: EM | Admit: 2016-04-08 | Discharge: 2016-04-08 | Disposition: A | Discharge status: Home / Self Care | Payer: BC Managed Care – PPO | Source: Home / Self Care | Attending: Family Medicine | Admitting: Family Medicine

## 2016-04-08 DIAGNOSIS — B9789 Other viral agents as the cause of diseases classified elsewhere: Secondary | ICD-10-CM | POA: Diagnosis not present

## 2016-04-08 DIAGNOSIS — J069 Acute upper respiratory infection, unspecified: Secondary | ICD-10-CM

## 2016-04-08 MED ORDER — BENZONATATE 200 MG PO CAPS: ORAL_CAPSULE | ORAL | 0 refills | Status: DC

## 2016-04-08 MED ORDER — DOXYCYCLINE HYCLATE 100 MG PO CAPS: 100.0000 mg | ORAL_CAPSULE | Freq: Two times a day (BID) | ORAL | 0 refills | Status: DC

## 2016-04-08 MED ORDER — PREDNISONE 20 MG PO TABS: ORAL_TABLET | ORAL | 0 refills | Status: DC

## 2016-04-08 NOTE — ED Triage Notes (Signed)
Pt c/o nasal congestion, sinus pressure and swollen tender lymph node on her LT neck x 2-3 days. Denies fever.

## 2016-04-08 NOTE — ED Provider Notes (Signed)
[facility]    CSN: [id number] Arrival date & time: 04/08/16  1452     History   Chief Complaint Chief Complaint  Patient presents with  . Nasal Congestion    HPI Sharon Wade is a 61 y.o. female.   Patient complains of 2 to 3 day history of increased sinus congestion, mild cough, fatigue, myalgias, and chills/sweats.  No fever. She states that her colds tend to linger.  She has a history of seasonal rhinitis and past history of exercise asthma as a child.  She also has a family history of asthma (maternal grandfather, and maternal cousins).   The history is provided by the patient.    Past Medical History:  Diagnosis Date  . Diverticular disease   . Thyroid disease     Patient Active Problem List   Diagnosis Date Noted  . Tender lymph node 06/04/2014  . Trochanteric bursitis of right hip 01/02/2014  . Seasonal affective disorder (HCC) 01/02/2014  . Hypothyroidism 02/06/2013  . Hyperlipidemia 02/06/2013  . Preventive measure 02/06/2013    Past Surgical History:  Procedure Laterality Date  . ABDOMINAL HYSTERECTOMY    . APPENDECTOMY    . CHOLECYSTECTOMY    . I&D EXTREMITY Left 02/03/2013   Procedure: IRRIGATION AND DEBRIDEMENT EXTREMITY;  Surgeon: Knute NeuHarrill Coley, MD;  Location: [facility] OR;  Service: Plastics;  Laterality: Left;  . OOPHORECTOMY      OB History    No data available       Home Medications    Prior to Admission medications   Medication Sig Start Date End Date Taking? Authorizing Provider  benzonatate (TESSALON) 200 MG capsule Take one cap by mouth at bedtime as needed for cough.  May repeat in 4 to 6 hours 04/08/16   Lattie HawStephen A Vergil Burby, MD  cetirizine (ZYRTEC) 10 MG tablet Take 10 mg by mouth daily.    Historical Provider, MD  CINNAMON PO Take 1 tablet by mouth daily.    Historical Provider, MD  Cyanocobalamin (VITAMIN B-12) 2500 MCG SUBL Place 1 tablet under the tongue daily.     Historical Provider, MD  doxycycline (VIBRAMYCIN) 100  MG capsule Take 1 capsule (100 mg total) by mouth 2 (two) times daily. Take with food (Rx void after 04/18/16) 04/08/16   Lattie HawStephen A Jerusha Reising, MD  estrogens, conjugated, (PREMARIN) 0.625 MG tablet Take 0.625 mg by mouth daily. Take daily for 21 days then do not take for 7 days.    Historical Provider, MD  fish oil-omega-3 fatty acids 1000 MG capsule Take 2 g by mouth daily.    Historical Provider, MD  levothyroxine (SYNTHROID, LEVOTHROID) 125 MCG tablet Take 125 mcg by mouth daily before breakfast.    Historical Provider, MD  predniSONE (DELTASONE) 20 MG tablet Take one tab by mouth twice daily for 5 days, then one daily. Take with food. 04/08/16   Lattie HawStephen A Capers Hagmann, MD    Family History Family History  Problem Relation Age of Onset  . Diabetes Mother   . Hypertension Mother   . Heart failure Mother   . Parkinson's disease Father     Social History Social History  Substance Use Topics  . Smoking status: Never Smoker  . Smokeless tobacco: Never Used  . Alcohol use No     Allergies   Cephalosporins; Morphine and related; Penicillins; Codeine; Tape; and Vancomycin   Review of Systems Review of Systems No sore throat + cough No pleuritic pain No wheezing + nasal congestion + post-nasal drainage  No sinus pain/pressure No itchy/red eyes No earache No hemoptysis No SOB No fever, + chills/sweats No nausea No vomiting No abdominal pain No diarrhea No urinary symptoms No skin rash + fatigue + myalgias No headache Used OTC meds without relief   Physical Exam Triage Vital Signs ED Triage Vitals  Enc Vitals Group     BP 04/08/16 1514 125/78     Pulse Rate 04/08/16 1514 72     Resp 04/08/16 1514 18     Temp 04/08/16 1514 98.2 F (36.8 C)     Temp Source 04/08/16 1514 Oral     SpO2 04/08/16 1514 99 %     Weight 04/08/16 1514 171 lb (77.6 kg)     Height 04/08/16 1514 5\' 6"  (1.676 m)     Head Circumference --      Peak Flow --      Pain Score 04/08/16 1515 0     Pain Loc  --      Pain Edu? --      Excl. in GC? --    No data found.   Updated Vital Signs BP 125/78 (BP Location: Left Arm)   Pulse 72   Temp 98.2 F (36.8 C) (Oral)   Resp 18   Ht 5\' 6"  (1.676 m)   Wt 171 lb (77.6 kg)   SpO2 99%   BMI 27.60 kg/m   Visual Acuity Right Eye Distance:   Left Eye Distance:   Bilateral Distance:    Right Eye Near:   Left Eye Near:    Bilateral Near:     Physical Exam Nursing notes and Vital Signs reviewed. Appearance:  Patient appears stated age, and in no acute distress Eyes:  Pupils are equal, round, and reactive to light and accomodation.  Extraocular movement is intact.  Conjunctivae are not inflamed  Ears:  Canals normal.  Tympanic membranes normal.  Nose:  Mildly congested turbinates.  No sinus tenderness.    Pharynx:  Normal Neck:  Supple.  Tender enlarged posterior/lateral nodes are palpated bilaterally  Lungs:  Clear to auscultation.  Breath sounds are equal.  Moving air well. Chest:  Distinct tenderness to palpation over the mid-sternum.  Heart:  Regular rate and rhythm without murmurs, rubs, or gallops.  Abdomen:  Nontender without masses or hepatosplenomegaly.  Bowel sounds are present.  No CVA or flank tenderness.  Extremities:  No edema.  Skin:  No rash present.    UC Treatments / Results  Labs (all labs ordered are listed, but only abnormal results are displayed) Labs Reviewed - No data to display  EKG  EKG Interpretation None       Radiology No results found.  Procedures Procedures (including critical care time)  Medications Ordered in UC Medications - No data to display   Initial Impression / Assessment and Plan / UC Course  I have reviewed the triage vital signs and the nursing notes.  Pertinent labs & imaging results that were available during my care of the patient were reviewed by me and considered in my medical decision making (see chart for details).  Clinical Course   With patient's history of seasonal  rhinitis, family history of asthma, past history of asthma, and past history of viral URI's that linger,she probably still has a predisposition to mild reactive airways disease.  Begin prednisone burst/taper. Prescription written for Benzonatate [facility]) to take at bedtime for night-time cough.  Take plain guaifenesin (1200mg  extended release tabs such as Mucinex) twice daily, with plenty  of water, for cough and congestion.  May add Pseudoephedrine (30mg , one or two every 4 to 6 hours) for sinus congestion.  Get adequate rest.   May use Afrin nasal spray (or generic oxymetazoline) twice daily for about 5 days and then discontinue.  Also recommend using saline nasal spray several times daily and saline nasal irrigation (AYR is a common brand).  Use Flonase nasal spray each morning after using Afrin nasal spray and saline nasal irrigation. Try warm salt water gargles for sore throat.  Stop all antihistamines for now, and other non-prescription cough/cold preparations. Begin Doxycycline if not improving about one week or if persistent fever develops (Given a prescription to hold, with an expiration date)  Follow-up with family doctor if not improving about10 days.      Final Clinical Impressions(s) / UC Diagnoses   Final diagnoses:  Viral URI with cough    New Prescriptions New Prescriptions   BENZONATATE (TESSALON) 200 MG CAPSULE    Take one cap by mouth at bedtime as needed for cough.  May repeat in 4 to 6 hours   DOXYCYCLINE (VIBRAMYCIN) 100 MG CAPSULE    Take 1 capsule (100 mg total) by mouth 2 (two) times daily. Take with food (Rx void after 04/18/16)   PREDNISONE (DELTASONE) 20 MG TABLET    Take one tab by mouth twice daily for 5 days, then one daily. Take with food.     Lattie Haw, MD 04/21/16 2018

## 2016-04-08 NOTE — Discharge Instructions (Signed)
Take plain guaifenesin (1200mg extended release tabs such as Mucinex) twice daily, with plenty of water, for cough and congestion.  May add Pseudoephedrine (30mg, one or two every 4 to 6 hours) for sinus congestion.  Get adequate rest.   °May use Afrin nasal spray (or generic oxymetazoline) twice daily for about 5 days and then discontinue.  Also recommend using saline nasal spray several times daily and saline nasal irrigation (AYR is a common brand).  Use Flonase nasal spray each morning after using Afrin nasal spray and saline nasal irrigation. °Try warm salt water gargles for sore throat.  °Stop all antihistamines for now, and other non-prescription cough/cold preparations. °Begin Doxycycline if not improving about one week or if persistent fever develops   °Follow-up with family doctor if not improving about10 days.  °

## 2016-04-09 ENCOUNTER — Ambulatory Visit: Payer: BC Managed Care – PPO | Admitting: Family Medicine

## 2016-12-28 ENCOUNTER — Ambulatory Visit (INDEPENDENT_AMBULATORY_CARE_PROVIDER_SITE_OTHER): Payer: BC Managed Care – PPO | Admitting: Physician Assistant

## 2016-12-28 ENCOUNTER — Encounter: Payer: Self-pay | Admitting: Physician Assistant

## 2016-12-28 VITALS — BP 141/52 | HR 68 | Temp 97.8°F | Ht 66.0 in | Wt 150.0 lb

## 2016-12-28 DIAGNOSIS — J0100 Acute maxillary sinusitis, unspecified: Secondary | ICD-10-CM | POA: Diagnosis not present

## 2016-12-28 DIAGNOSIS — H6981 Other specified disorders of Eustachian tube, right ear: Secondary | ICD-10-CM | POA: Diagnosis not present

## 2016-12-28 MED ORDER — PREDNISONE 50 MG PO TABS: ORAL_TABLET | ORAL | 0 refills | Status: DC

## 2016-12-28 MED ORDER — AZITHROMYCIN 250 MG PO TABS: ORAL_TABLET | ORAL | 0 refills | Status: DC

## 2016-12-28 NOTE — Progress Notes (Addendum)
   Subjective:    Patient ID: Sharon Wade, female    DOB: 06-13-1955, 61 y.o.   MRN: [id number]  HPI The patient is a 61 year old female who presents to the clinic today with a chief complaint of right ear pain. She reports the pain began Friday morning and was accompanied by a sore throat that has since subsided. She states that she had chills last night but did not check her temperature, sinus pressure, congestion, headache, tender lymph nodes, and fatigue. She denies a cough or recent dental work. She does feel like the right side of her face and teeth ache. She reports sick contacts at work and her husband. She also reports congestion two weeks ago that has since subsided. She has seasonal allergies, particularly ragweed. She takes Xyzal daily for allergies.   .. Active Ambulatory Problems    Diagnosis Date Noted  . Hypothyroidism 02/06/2013  . Hyperlipidemia 02/06/2013  . Preventive measure 02/06/2013  . Trochanteric bursitis of right hip 01/02/2014  . Seasonal affective disorder (HCC) 01/02/2014  . Tender lymph node 06/04/2014   Resolved Ambulatory Problems    Diagnosis Date Noted  . Cellulitis of hand, left 02/06/2013  . Sinusitis 04/10/2013  . Acute frontal sinusitis 03/06/2014  . Acute pansinusitis 06/04/2014   Past Medical History:  Diagnosis Date  . Diverticular disease   . Thyroid disease       Review of Systems  All other systems reviewed and are negative.      Objective:   Physical Exam  Constitutional: She is oriented to person, place, and time. She appears well-developed and well-nourished.  HENT:  Head: Normocephalic and atraumatic.  Mouth/Throat: Oropharynx is clear and moist.  Right maxillary swelling noted on visual inspection.  Tenderness to palpation of the right maxillary sinus.  Positive tragus and tug test of the right auricle. Left auricle normal.  Right EAC mildly erythematous. Left EAC normal. Bilateral TM clear.  Nasopharynx clear and  moist.   Eyes: Conjunctivae are normal.  Neck:  Tonsilar, anterior and posterior cervical lymph node tenderness to palpation.  No lypmhadenopathy noted with palpation.   Cardiovascular: Normal rate, regular rhythm and normal heart sounds.   Pulmonary/Chest: Effort normal and breath sounds normal. She has no wheezes.  Neurological: She is alert and oriented to person, place, and time.  Psychiatric: She has a normal mood and affect. Her behavior is normal.        Assessment & Plan:   Marland KitchenMarland KitchenJetaun was seen today for right ear pain.  Diagnoses and all orders for this visit:  Acute non-recurrent maxillary sinusitis -     azithromycin (ZITHROMAX) 250 MG tablet; Take 2 tablets now and then one tablet for 4 days. -     predniSONE (DELTASONE) 50 MG tablet; Take one tablet for 5 days.  Dysfunction of right eustachian tube -     predniSONE (DELTASONE) 50 MG tablet; Take one tablet for 5 days.   I suspect acute maxillary sinusitis. She will start Azithromycin. A prednisone burst was printed for her today. If her symptoms do not begin to improve in two days, she can add the prednisone.HO given.

## 2017-02-09 ENCOUNTER — Encounter: Payer: Self-pay | Admitting: Sports Medicine

## 2017-02-09 ENCOUNTER — Ambulatory Visit: Payer: BC Managed Care – PPO | Admitting: Sports Medicine

## 2017-02-09 DIAGNOSIS — J0100 Acute maxillary sinusitis, unspecified: Secondary | ICD-10-CM | POA: Diagnosis not present

## 2017-02-09 DIAGNOSIS — Z299 Encounter for prophylactic measures, unspecified: Secondary | ICD-10-CM

## 2017-02-09 MED ORDER — DOXYCYCLINE HYCLATE 100 MG PO TABS: 100.0000 mg | ORAL_TABLET | Freq: Two times a day (BID) | ORAL | 0 refills | Status: AC

## 2017-02-09 MED ORDER — DOXYCYCLINE HYCLATE 100 MG PO TABS: 100.0000 mg | ORAL_TABLET | Freq: Two times a day (BID) | ORAL | 0 refills | Status: DC

## 2017-02-09 NOTE — Assessment & Plan Note (Addendum)
Up to date on mammogram, [facility] Tells me she did do the ColoGuard test, we do not have results yet, we will call to look into this. Had recent labs done at the complementary and alternative medicine clinic.  Called ColoGuard, testing was negative in 2016.

## 2017-02-09 NOTE — Assessment & Plan Note (Signed)
With the classic double sickening. Continue nasal fluticasone, recently had azithromycin, using doxycycline this time. Allergic to penicillins and cephalosporins. Return as needed.

## 2017-02-09 NOTE — Progress Notes (Signed)
  Subjective:    CC: Facial pain  HPI: For the past several days to weeks this 61 year old female has had increasing pain and pressure behind her nose, forehead with radiation to the right ear, nasal discharge, initial mild cough that improved, facial pressure improved and then worsened.  Moderate, persistent, low-grade fevers and chills.  Past medical history:  Negative.  See flowsheet/record as well for more information.  Surgical history: Negative.  See flowsheet/record as well for more information.  Family history: Negative.  See flowsheet/record as well for more information.  Social history: Negative.  See flowsheet/record as well for more information.  Allergies, and medications have been entered into the medical record, reviewed, and no changes needed.   Review of Systems: No fevers, chills, night sweats, weight loss, chest pain, or shortness of breath.   Objective:    General: Well Developed, well nourished, and in no acute distress.  Neuro: Alert and oriented x3, extra-ocular muscles intact, sensation grossly intact.  HEENT: Normocephalic, atraumatic, pupils equal round reactive to light, neck supple, no masses, no lymphadenopathy, thyroid nonpalpable.  Oropharynx, nasopharynx, ear canals unremarkable, tenderness over the frontal and maxillary sinuses. Skin: Warm and dry, no rashes. Cardiac: Regular rate and rhythm, no murmurs rubs or gallops, no lower extremity edema.  Respiratory: Clear to auscultation bilaterally. Not using accessory muscles, speaking in full sentences.  Impression and Recommendations:    Acute non-recurrent maxillary sinusitis With the classic double sickening. Continue nasal fluticasone, recently had azithromycin, using doxycycline this time. Allergic to penicillins and cephalosporins. Return as needed.  Preventive measure Up to date on [facility]ogram, [facility] Tells me she did do the ColoGuard test, we do not have results  yet, we will call to look into this. Had recent labs done at the complementary and alternative medicine clinic.  Called ColoGuard, testing was negative in 2016.  ___________________________________________ Ihor Austinhomas J. Benjamin Stainhekkekandam, M.D., ABFM., CAQSM. Primary Care and Sports Medicine [facility]  Adjunct Instructor of Family Medicine  [employer]

## 2017-02-15 ENCOUNTER — Telehealth: Payer: Self-pay

## 2017-02-15 NOTE — Telephone Encounter (Signed)
Sharon GowerCharlotte called and states stomach pain when she takes the doxycycline. Denies fever, chills, sweats, nausea, vomiting or diarrhea. She is doesn't want to take the doxycycline today because of the stomach pain. Stomach pain is all over not localized in one area. Please advise.   Her sinus symptoms are better but now she has laryngitis.

## 2017-02-17 NOTE — Telephone Encounter (Signed)
Patient is doing better today.

## 2017-02-17 NOTE — Telephone Encounter (Signed)
She should try taking the doxycycline with a large amount of food, if this still fails then she can stop it and simply do vocal rest.

## 2017-12-27 ENCOUNTER — Emergency Department
Admission: EM | Admit: 2017-12-27 | Discharge: 2017-12-27 | Disposition: A | Discharge status: Home / Self Care | Payer: BC Managed Care – PPO | Source: Home / Self Care | Attending: Family Medicine | Admitting: Family Medicine

## 2017-12-27 ENCOUNTER — Encounter: Payer: Self-pay | Admitting: Emergency Medicine

## 2017-12-27 DIAGNOSIS — J329 Chronic sinusitis, unspecified: Secondary | ICD-10-CM

## 2017-12-27 MED ORDER — DOXYCYCLINE HYCLATE 100 MG PO CAPS: 100.0000 mg | ORAL_CAPSULE | Freq: Two times a day (BID) | ORAL | 0 refills | Status: DC

## 2017-12-27 NOTE — ED Triage Notes (Signed)
Pt c/o facial pain and sinus pressure since Friday. Denies meds.

## 2017-12-27 NOTE — ED Provider Notes (Signed)
[facility]    CSN: [id number] Arrival date & time: 12/27/17  1533     History   Chief Complaint Chief Complaint  Patient presents with  . Facial Pain    HPI Sharon Wade is a 62 y.o. female.   HPI  Sharon Wade is a 62 y.o. female presenting to UC with c/o 3-4 days of sinus congestion with facial pain and pressure, moderate to severe.  She has tried [employer] but no medication taken today. Bilateral ear pressure and pain, scratchy throat. 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 Denies fever, chills, n/v/d.  Pt reports recurrent sinus infections, which typically occur shortly after school starts.    Past Medical History:  Diagnosis Date  . Diverticular disease   . Thyroid disease     Patient Active Problem List   Diagnosis Date Noted  . Acute non-recurrent maxillary sinusitis 02/09/2017  . Tender lymph node 06/04/2014  . Trochanteric bursitis of right hip 01/02/2014  . Seasonal affective disorder (HCC) 01/02/2014  . Hypothyroidism 02/06/2013  . Hyperlipidemia 02/06/2013  . Preventive measure 02/06/2013    Past Surgical History:  Procedure Laterality Date  . ABDOMINAL HYSTERECTOMY    . APPENDECTOMY    . CHOLECYSTECTOMY    . I&D EXTREMITY Left 02/03/2013   Procedure: IRRIGATION AND DEBRIDEMENT EXTREMITY;  Surgeon: Knute Neu, MD;  Location: [facility] OR;  Service: Plastics;  Laterality: Left;  . OOPHORECTOMY      OB History   None      Home Medications    Prior to Admission medications   Medication Sig Start Date End Date Taking? Authorizing Provider  CINNAMON PO Take 1 tablet by mouth daily.    [provider]  Cyanocobalamin (VITAMIN B-12) 2500 MCG SUBL Place 1 tablet under the tongue daily.     [provider]  doxycycline (VIBRAMYCIN) 100 MG capsule Take 1 capsule (100 mg total) by mouth 2 (two) times daily. One po bid x 7 days 12/27/17   Lurene Shadow, PA-C  estrogens, conjugated,  (PREMARIN) 0.625 MG tablet Take 0.625 mg by mouth daily. Take daily for 21 days then do not take for 7 days.    [provider]  fish oil-omega-3 fatty acids 1000 MG capsule Take 2 g by mouth daily.    [provider]  levocetirizine (XYZAL) 5 MG tablet Take 5 mg by mouth every evening.    [provider]  levothyroxine (SYNTHROID, LEVOTHROID) 125 MCG tablet Take 125 mcg by mouth daily before breakfast.    [provider]    Family History Family History  Problem Relation Age of Onset  . Diabetes Mother   . Hypertension Mother   . Heart failure Mother   . Parkinson's disease Father     Social History Social History   Tobacco Use  . Smoking status: Never Smoker  . Smokeless tobacco: Never Used  Substance Use Topics  . Alcohol use: No  . Drug use: No     Allergies   Cephalosporins; Doxycycline; Morphine and related; Penicillins; Codeine; Tape; and Vancomycin   Review of Systems Review of Systems  HENT: Positive for congestion, ear pain, postnasal drip, sinus pressure, sinus pain and sore throat.   Respiratory: Negative for cough, chest tightness, shortness of breath and wheezing.   Gastrointestinal: Negative for diarrhea, nausea and vomiting.  Musculoskeletal: Negative for neck pain and neck stiffness.  Neurological: Positive for headaches. Negative for dizziness and light-headedness.  Physical Exam Triage Vital Signs ED Triage Vitals  Enc Vitals Group     BP 12/27/17 1603 125/74     Pulse Rate 12/27/17 1603 80     Resp --      Temp 12/27/17 1603 98.5 F (36.9 C)     Temp Source 12/27/17 1603 Oral     SpO2 12/27/17 1603 99 %     Weight 12/27/17 1604 155 lb (70.3 kg)     Height --      Head Circumference --      Peak Flow --      Pain Score 12/27/17 1604 0     Pain Loc --      Pain Edu? --      Excl. in GC? --    No data found.  Updated Vital Signs BP 125/74 (BP Location: Right Arm)   Pulse 80   Temp 98.5 F (36.9  C) (Oral)   Wt 155 lb (70.3 kg)   SpO2 99%   BMI 25.02 kg/m   Visual Acuity Right Eye Distance:   Left Eye Distance:   Bilateral Distance:    Right Eye Near:   Left Eye Near:    Bilateral Near:     Physical Exam  Constitutional: She is oriented to person, place, and time. She appears well-developed and well-nourished. No distress.  HENT:  Head: Normocephalic and atraumatic.  Right Ear: Tympanic membrane normal.  Left Ear: Tympanic membrane normal.  Nose: Mucosal edema present. Right sinus exhibits maxillary sinus tenderness and frontal sinus tenderness. Left sinus exhibits maxillary sinus tenderness and frontal sinus tenderness.  Mouth/Throat: Uvula is midline, oropharynx is clear and moist and mucous membranes are normal.  Eyes: EOM are normal.  Neck: Normal range of motion. Neck supple.  Cardiovascular: Normal rate and regular rhythm.  Pulmonary/Chest: Effort normal and breath sounds normal. No stridor. No respiratory distress. She has no wheezes. She has no rales.  Musculoskeletal: Normal range of motion.  Lymphadenopathy:    She has no cervical adenopathy.  Neurological: She is alert and oriented to person, place, and time.  Skin: Skin is warm and dry. She is not diaphoretic.  Psychiatric: She has a normal mood and affect. Her behavior is normal.  Nursing note and vitals reviewed.    UC Treatments / Results  Labs (all labs ordered are listed, but only abnormal results are displayed) Labs Reviewed - No data to display  EKG None  Radiology No results found.  Procedures Procedures (including critical care time)  Medications Ordered in UC Medications - No data to display  Initial Impression / Assessment and Plan / UC Course  I have reviewed the triage vital signs and the nursing notes.  Pertinent labs & imaging results that were available during my care of the patient were reviewed by me and considered in my medical decision making (see chart for  details).     Hx and exam c/w sinusitis given severity of sinus tenderness on exam. Pt info packet provided.  Final Clinical Impressions(s) / UC Diagnoses   Final diagnoses:  Recurrent sinusitis     Discharge Instructions      Please take antibiotics as prescribed and be sure to complete entire course even if you start to feel better to ensure infection does not come back.  Taking your medication with food and taking probiotics or eating yogurt with probiotics can help prevent stomach upset.   Please call to schedule a follow up appointment with your family doctor  next week if not improving or for recurrent sinus infections.     ED Prescriptions    Medication Sig Dispense Auth. Provider   doxycycline (VIBRAMYCIN) 100 MG capsule Take 1 capsule (100 mg total) by mouth 2 (two) times daily. One po bid x 7 days 14 capsule Lurene Shadow, [place]     Controlled Substance Prescriptions [place] Controlled Substance Registry consulted? Not Applicable   Rolla Plate 12/27/17 1728

## 2017-12-27 NOTE — Discharge Instructions (Addendum)
°  Please take antibiotics as prescribed and be sure to complete entire course even if you start to feel better to ensure infection does not come back.  Taking your medication with food and taking probiotics or eating yogurt with probiotics can help prevent stomach upset.   Please call to schedule a follow up appointment with your family doctor next week if not improving or for recurrent sinus infections.

## 2018-05-12 ENCOUNTER — Encounter: Payer: Self-pay | Admitting: Sports Medicine

## 2018-05-12 ENCOUNTER — Ambulatory Visit: Payer: BC Managed Care – PPO | Admitting: Sports Medicine

## 2018-05-12 DIAGNOSIS — R69 Illness, unspecified: Secondary | ICD-10-CM | POA: Diagnosis not present

## 2018-05-12 DIAGNOSIS — J111 Influenza due to unidentified influenza virus with other respiratory manifestations: Secondary | ICD-10-CM | POA: Insufficient documentation

## 2018-05-12 MED ORDER — OSELTAMIVIR PHOSPHATE 75 MG PO CAPS: 75.0000 mg | ORAL_CAPSULE | Freq: Two times a day (BID) | ORAL | 0 refills | Status: DC

## 2018-05-12 NOTE — Assessment & Plan Note (Signed)
Tamiflu, over-the-counter cold and flu medications. Continue Flonase, she is having some eustachian tube dysfunction. Return if no better in a couple of weeks.

## 2018-05-12 NOTE — Progress Notes (Signed)
Subjective:    CC: Feeling sick  HPI: For a day and a half now a 63 year old female has had pain in her right ear, fevers, chills, headaches, muscle aches and body aches.  No real cough, no nausea, vomiting, GI symptoms, no skin rash, no urinary symptoms.  I reviewed the past medical history, family history, social history, surgical history, and allergies today and no changes were needed.  Please see the problem list section below in epic for further details.  Past Medical History: Past Medical History:  Diagnosis Date  . Diverticular disease   . Thyroid disease    Past Surgical History: Past Surgical History:  Procedure Laterality Date  . ABDOMINAL HYSTERECTOMY    . APPENDECTOMY    . CHOLECYSTECTOMY    . I&D EXTREMITY Left 02/03/2013   Procedure: IRRIGATION AND DEBRIDEMENT EXTREMITY;  Surgeon: Knute Neu, MD;  Location: [facility] OR;  Service: Plastics;  Laterality: Left;  . OOPHORECTOMY     Social History: Social History   Socioeconomic History  . Marital status: Married    Spouse name: Not on file  . Number of children: Not on file  . Years of education: Not on file  . Highest education level: Not on file  Occupational History  . Not on file  Social Needs  . Financial resource strain: Not on file  . Food insecurity:    Worry: Not on file    Inability: Not on file  . Transportation needs:    Medical: Not on file    Non-medical: Not on file  Tobacco Use  . Smoking status: Never Smoker  . Smokeless tobacco: Never Used  Substance and Sexual Activity  . Alcohol use: No  . Drug use: No  . Sexual activity: Not on file  Lifestyle  . Physical activity:    Days per week: Not on file    Minutes per session: Not on file  . Stress: Not on file  Relationships  . Social connections:    Talks on phone: Not on file    Gets together: Not on file    Attends religious service: Not on file    Active member of club or organization: Not on file    Attends meetings of clubs or  organizations: Not on file    Relationship status: Not on file  Other Topics Concern  . Not on file  Social History Narrative  . Not on file   Family History: Family History  Problem Relation Age of Onset  . Diabetes Mother   . Hypertension Mother   . Heart failure Mother   . Parkinson's disease Father    Allergies: Allergies  Allergen Reactions  . Cephalosporins Hives  . Doxycycline Diarrhea  . Morphine And Related Hives  . Penicillins Hives  . Codeine Rash  . Tape Rash    Paper tape OK  . Vancomycin Hives and Rash   Medications: See med rec.  Review of Systems: No fevers, chills, night sweats, weight loss, chest pain, or shortness of breath.   Objective:    General: Well Developed, well nourished, and in no acute distress.  Neuro: Alert and oriented x3, extra-ocular muscles intact, sensation grossly intact.  HEENT: Normocephalic, atraumatic, pupils equal round reactive to light, neck supple, no masses, tender shotty cervical lymphadenopathy, thyroid nonpalpable.  Oropharynx, nasopharynx, ear canals unremarkable Skin: Warm and dry, no rashes. Cardiac: Regular rate and rhythm, no murmurs rubs or gallops, no lower extremity edema.  Respiratory: Clear to auscultation bilaterally. Not using  accessory muscles, speaking in full sentences.  Impression and Recommendations:    Influenza-like illness Tamiflu, over-the-counter cold and flu medications. Continue Flonase, she is having some eustachian tube dysfunction. Return if no better in a couple of weeks. ___________________________________________ Ihor Austin. Benjamin Stain, M.D., ABFM., CAQSM. Primary Care and Sports Medicine [facility]  Adjunct Professor of Family Medicine  [employer]

## 2018-05-12 NOTE — Patient Instructions (Signed)
Viral Respiratory Infection  A respiratory infection is an illness that affects part of the respiratory system, such as the lungs, nose, or throat. A respiratory infection that is caused by a virus is called a viral respiratory infection.  Common types of viral respiratory infections include:  · A cold.  · The flu (influenza).  · A respiratory syncytial virus (RSV) infection.  What are the causes?  This condition is caused by a virus.  What are the signs or symptoms?  Symptoms of this condition include:  · A stuffy or runny nose.  · Yellow or green nasal discharge.  · A cough.  · Sneezing.  · Fatigue.  · Achy muscles.  · A sore throat.  · Sweating or chills.  · A fever.  · A headache.  How is this diagnosed?  This condition may be diagnosed based on:  · Your symptoms.  · A physical exam.  · Testing of nasal swabs.  How is this treated?  This condition may be treated with medicines, such as:  · Antiviral medicine. This may shorten the length of time a person has symptoms.  · Expectorants. These make it easier to cough up mucus.  · Decongestant nasal sprays.  · Acetaminophen or NSAIDs to relieve fever and pain.  Antibiotic medicines are not prescribed for viral infections. This is because antibiotics are designed to kill bacteria. They are not effective against viruses.  Follow these instructions at home:    Managing pain and congestion  · Take over-the-counter and prescription medicines only as told by your health care provider.  · If you have a sore throat, gargle with a salt-water mixture 3-4 times a day or as needed. To make a salt-water mixture, completely dissolve ½-1 tsp of salt in 1 cup of warm water.  · Use nose drops made from salt water to ease congestion and soften raw skin around your nose.  · Drink enough fluid to keep your urine pale yellow. This helps prevent dehydration and helps loosen up mucus.  General instructions  · Rest as much as possible.  · Do not drink alcohol.  · Do not use any products  that contain nicotine or tobacco, such as cigarettes and e-cigarettes. If you need help quitting, ask your health care provider.  · Keep all follow-up visits as told by your health care provider. This is important.  How is this prevented?    · Get an annual flu shot. You may get the flu shot in late summer, fall, or winter. Ask your health care provider when you should get your flu shot.  · Avoid exposing others to your respiratory infection.  ? Stay home from work or school as told by your health care provider.  ? Wash your hands with soap and water often, especially after you cough or sneeze. If soap and water are not available, use alcohol-based hand sanitizer.  · Avoid contact with people who are sick during cold and flu season. This is generally fall and winter.  Contact a health care provider if:  · Your symptoms last for 10 days or longer.  · Your symptoms get worse over time.  · You have a fever.  · You have severe sinus pain in your face or forehead.  · The glands in your jaw or neck become very swollen.  Get help right away if you:  · Feel pain or pressure in your chest.  · Have shortness of breath.  · Faint or feel like   you will faint.  · Have severe and persistent vomiting.  · Feel confused or disoriented.  Summary  · A respiratory infection is an illness that affects part of the respiratory system, such as the lungs, nose, or throat. A respiratory infection that is caused by a virus is called a viral respiratory infection.  · Common types of viral respiratory infections are a cold, influenza, and respiratory syncytial virus (RSV) infection.  · Symptoms of this condition include a stuffy or runny nose, cough, sneezing, fatigue, achy muscles, sore throat, and fevers or chills.  · Antibiotic medicines are not prescribed for viral infections. This is because antibiotics are designed to kill bacteria. They are not effective against viruses.  This information is not intended to replace advice given to you by  your health care provider. Make sure you discuss any questions you have with your health care provider.  Document Released: 12/24/2004 Document Revised: 04/26/2017 Document Reviewed: 04/26/2017  Elsevier Interactive Patient Education © 2019 [employer]

## 2018-06-27 ENCOUNTER — Encounter: Payer: Self-pay | Admitting: Sports Medicine

## 2019-03-12 ENCOUNTER — Emergency Department
Admission: EM | Admit: 2019-03-12 | Discharge: 2019-03-12 | Disposition: A | Discharge status: Home / Self Care | Payer: BC Managed Care – PPO | Source: Home / Self Care

## 2019-03-12 ENCOUNTER — Other Ambulatory Visit: Payer: Self-pay

## 2019-03-12 ENCOUNTER — Encounter: Payer: Self-pay | Admitting: Emergency Medicine

## 2019-03-12 DIAGNOSIS — B9789 Other viral agents as the cause of diseases classified elsewhere: Secondary | ICD-10-CM | POA: Diagnosis not present

## 2019-03-12 DIAGNOSIS — J111 Influenza due to unidentified influenza virus with other respiratory manifestations: Secondary | ICD-10-CM

## 2019-03-12 DIAGNOSIS — J069 Acute upper respiratory infection, unspecified: Secondary | ICD-10-CM

## 2019-03-12 DIAGNOSIS — R05 Cough: Secondary | ICD-10-CM

## 2019-03-12 LAB — POC SARS CORONAVIRUS 2 AG -  ED: SARS Coronavirus 2 Ag: NEGATIVE

## 2019-03-12 NOTE — ED Triage Notes (Signed)
Pt here with wife after husband had pos contact exposure last Tuesday. Stated having slight dry cough, itchy throat yesterday.

## 2019-03-12 NOTE — ED Provider Notes (Signed)
[facility]    CSN: [id number] Arrival date & time: 03/12/19  1134      History   Chief Complaint Chief Complaint  Patient presents with  . Cough    HPI Sharon Wade is a 63 y.o. female.   The history is provided by the patient. No language interpreter was used.  Cough Cough characteristics:  Non-productive Sputum characteristics:  Nondescript Severity:  Moderate Onset quality:  Gradual Timing:  Constant Smoker: no   Relieved by:  Nothing Worsened by:  Nothing Ineffective treatments:  None tried Associated symptoms: no fever   Pt exposed to covid on Tuesday.  Pt is here with Husband.  Husband has a positive covid test.   Past Medical History:  Diagnosis Date  . Diverticular disease   . Thyroid disease     Patient Active Problem List   Diagnosis Date Noted  . Influenza-like illness 05/12/2018  . Trochanteric bursitis of right hip 01/02/2014  . Seasonal affective disorder ([place]) 01/02/2014  . Hypothyroidism 02/06/2013  . Hyperlipidemia 02/06/2013  . Preventive measure 02/06/2013    Past Surgical History:  Procedure Laterality Date  . ABDOMINAL HYSTERECTOMY    . APPENDECTOMY    . CHOLECYSTECTOMY    . I&D EXTREMITY Left 02/03/2013   Procedure: IRRIGATION AND DEBRIDEMENT EXTREMITY;  Surgeon: Dayna Barker, MD;  Location: [facility];  Service: Plastics;  Laterality: Left;  . OOPHORECTOMY      OB History   No obstetric history on file.      Home Medications    Prior to Admission medications   Medication Sig Start Date End Date Taking? Authorizing Provider  CINNAMON PO Take 1 tablet by mouth daily.    [provider]  Cyanocobalamin (VITAMIN B-12) 2500 MCG SUBL Place 1 tablet under the tongue daily.     [provider]  fish oil-omega-3 fatty acids 1000 MG capsule Take 2 g by mouth daily.    [provider]  levocetirizine (XYZAL) 5 MG tablet Take 5 mg by mouth every evening.    [provider]   levothyroxine (SYNTHROID, LEVOTHROID) 125 MCG tablet Take 125 mcg by mouth daily before breakfast.    [provider]  oseltamivir (TAMIFLU) 75 MG capsule Take 1 capsule (75 mg total) by mouth 2 (two) times daily. 05/12/18   Silverio Decamp, MD    Family History Family History  Problem Relation Age of Onset  . Diabetes Mother   . Hypertension Mother   . Heart failure Mother   . Parkinson's disease Father     Social History Social History   Tobacco Use  . Smoking status: Never Smoker  . Smokeless tobacco: Never Used  Substance Use Topics  . Alcohol use: No  . Drug use: No     Allergies   Cephalosporins, Doxycycline, Morphine and related, Penicillins, Codeine, Tape, and Vancomycin   Review of Systems Review of Systems  Constitutional: Negative for fever.  Respiratory: Positive for cough.   All other systems reviewed and are negative.    Physical Exam Triage Vital Signs ED Triage Vitals  Enc Vitals Group     BP 03/12/19 1217 (!) 154/84     Pulse Rate 03/12/19 1217 78     Resp --      Temp 03/12/19 1217 98.5 F (36.9 C)     Temp Source 03/12/19 1217 Oral     SpO2 03/12/19 1217 97 %     Weight --      Height 03/12/19  1218 5\' 4"  (1.626 m)     Head Circumference --      Peak Flow --      Pain Score 03/12/19 1218 0     Pain Loc --      Pain Edu? --      Excl. in [place]? --    No data found.  Updated Vital Signs BP (!) 154/84 (BP Location: Right Arm)   Pulse 78   Temp 98.5 F (36.9 C) (Oral)   Ht 5\' 4"  (1.626 m)   SpO2 97%   BMI 26.61 kg/m   Visual Acuity Right Eye Distance:   Left Eye Distance:   Bilateral Distance:    Right Eye Near:   Left Eye Near:    Bilateral Near:     Physical Exam Vitals and nursing note reviewed.  Constitutional:      Appearance: She is well-developed.  HENT:     Head: Normocephalic.  Cardiovascular:     Rate and Rhythm: Normal rate.  Pulmonary:     Effort: Pulmonary effort is normal.  Abdominal:      General: There is no distension.  Musculoskeletal:        General: Normal range of motion.     Cervical back: Normal range of motion.  Neurological:     Mental Status: She is alert and oriented to person, place, and time.  Psychiatric:        Mood and Affect: Mood normal.      UC Treatments / Results  Labs (all labs ordered are listed, but only abnormal results are displayed) Labs Reviewed  SARS-COV-2 RNA,(COVID-19) QUALITATIVE NAAT  POC SARS CORONAVIRUS 2 AG -  ED    EKG   Radiology No results found.  Procedures Procedures (including critical care time)  Medications Ordered in UC Medications - No data to display  Initial Impression / Assessment and Plan / UC Course  I have reviewed the triage vital signs and the nursing notes.  Pertinent labs & imaging results that were available during my care of the patient were reviewed by me and considered in my medical decision making (see chart for details).     MDM  Covid quick test negative.  [facility] send out ordered.  I suspect pt has early covid as her husband is positive Pt counseled on quarantining  Final Clinical Impressions(s) / UC Diagnoses   Final diagnoses:  Viral URI with cough  Influenza-like illness   Discharge Instructions   None    ED Prescriptions    None     PDMP not reviewed this encounter.  An After Visit Summary was printed and given to the patient.    03/14/19, 03/12/19 1656

## 2019-03-15 ENCOUNTER — Telehealth: Payer: Self-pay | Admitting: Emergency Medicine

## 2019-03-15 LAB — SARS-COV-2 RNA,(COVID-19) QUALITATIVE NAAT: SARS CoV2 RNA: NOT DETECTED

## 2019-04-05 ENCOUNTER — Encounter: Payer: Self-pay | Admitting: Medical-Surgical

## 2019-04-05 ENCOUNTER — Ambulatory Visit (INDEPENDENT_AMBULATORY_CARE_PROVIDER_SITE_OTHER): Payer: BC Managed Care – PPO | Admitting: Medical-Surgical

## 2019-04-05 VITALS — BP 122/74 | HR 85 | Temp 97.9°F | Wt 157.0 lb

## 2019-04-05 DIAGNOSIS — J0100 Acute maxillary sinusitis, unspecified: Secondary | ICD-10-CM | POA: Diagnosis not present

## 2019-04-05 DIAGNOSIS — R0982 Postnasal drip: Secondary | ICD-10-CM

## 2019-04-05 MED ORDER — AZITHROMYCIN 250 MG PO TABS: ORAL_TABLET | ORAL | 0 refills | Status: DC

## 2019-04-05 MED ORDER — IPRATROPIUM BROMIDE 0.03 % NA SOLN: 2.0000 | Freq: Two times a day (BID) | NASAL | 0 refills | Status: DC

## 2019-04-05 NOTE — Progress Notes (Addendum)
Virtual Visit via Video Note  I connected with Sharon Wade on 04/05/19 at  9:10 AM EST by a video enabled telemedicine application and verified that I am speaking with the correct person using two identifiers.   I discussed the limitations of evaluation and management by telemedicine and the availability of in person appointments. The patient expressed understanding and agreed to proceed.  Subjective:    CC: Sinus drainage, choking sensation when laying down  HPI:  Very pleasant 64 year old female presenting today with complaints of sinus drainage, intermittent ear fullness, and a mild nonproductive cough.  Reports thick, bloody nasal discharge in the mornings from the left nostril.  Increased sinus drainage at night interfering with sleep she reports feeling a choking sensation at times when lying down.  Mild tenderness over left maxillary sinus.  Was tested for Covid 4 times in December with 2 - test, 1+, and then negative again on 04/01/2019.  Husband was positive for Covid as well.  Covid course and recovery spent at home with no need for hospitalization.  Denies shortness of breath, fever, chills, headache, body aches, nausea, diarrhea, and sore throat.  Has been using saline nasal spray as needed, Flonase 1 spray each nostril twice daily, and nasal rinses with a Nettie pot as needed.  Does report frequent episodes of night sweats since she contracted Covid. 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 Would like a letter for work to allow her to return on Monday.  Past medical history, Surgical history, Family history not pertinant except as noted below, Social history, Allergies, and medications have been entered into the medical record, reviewed, and corrections made.   Review of Systems: No fevers, chills, weight loss, chest pain, or shortness of breath.   Objective:    General: Speaking clearly in complete sentences without  any shortness of breath.  Alert and oriented x3.  Normal judgment. No apparent acute distress.    Impression and Recommendations:    Acute maxillary sinusitis, nonrecurrent Multiple antibiotic allergies.  Will treat with Z-Pak.  Continue saline nasal spray, fluticasone, and Nettie pot use as needed.  04/07/2019- Added Prednisone burst dose x 5 days.  Postnasal drip Atrovent nasal spray twice daily.  Letter provided to return to work on 04/10/2019.  Return if symptoms worsen or fail to improve.   I discussed the assessment and treatment plan with the patient. The patient was provided an opportunity to ask questions and all were answered. The patient agreed with the plan and demonstrated an understanding of the instructions.   The patient was advised to call back or seek an in-person evaluation if the symptoms worsen or if the condition fails to improve as anticipated.  25 minutes of non-face-to-face time was provided during this encounter.  Thayer Ohm, DNP, APRN, FNP-BC

## 2019-04-07 MED ORDER — PREDNISONE 50 MG PO TABS: 50.0000 mg | ORAL_TABLET | Freq: Every day | ORAL | 0 refills | Status: DC

## 2019-04-07 NOTE — Addendum Note (Signed)
Addended byChristen Butter on: 04/07/2019 08:48 AM   Modules accepted: Orders

## 2019-08-04 ENCOUNTER — Emergency Department (INDEPENDENT_AMBULATORY_CARE_PROVIDER_SITE_OTHER)
Admission: EM | Admit: 2019-08-04 | Discharge: 2019-08-04 | Disposition: A | Discharge status: Home / Self Care | Payer: Self-pay | Source: Home / Self Care

## 2019-08-04 DIAGNOSIS — M549 Dorsalgia, unspecified: Secondary | ICD-10-CM

## 2019-08-04 DIAGNOSIS — R52 Pain, unspecified: Secondary | ICD-10-CM

## 2019-08-04 DIAGNOSIS — M546 Pain in thoracic spine: Secondary | ICD-10-CM

## 2019-08-04 MED ORDER — CYCLOBENZAPRINE HCL 5 MG PO TABS: 5.0000 mg | ORAL_TABLET | Freq: Two times a day (BID) | ORAL | 0 refills | Status: DC | PRN
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
## 2019-08-04 NOTE — Discharge Instructions (Signed)
  Flexeril (cyclobenzaprine) is a muscle relaxer and may cause drowsiness. Do not drink alcohol, drive, or operate heavy machinery while taking.  You may take 500mg  acetaminophen every 4-6 hours or in combination with ibuprofen 400-600mg  every 6-8 hours as needed for pain and inflammation.   Please follow up with Dr. later next week if not improving.

## 2019-10-31 ENCOUNTER — Ambulatory Visit (INDEPENDENT_AMBULATORY_CARE_PROVIDER_SITE_OTHER): Payer: BC Managed Care – PPO

## 2019-10-31 ENCOUNTER — Ambulatory Visit: Payer: BC Managed Care – PPO | Admitting: Sports Medicine

## 2019-10-31 ENCOUNTER — Other Ambulatory Visit: Payer: Self-pay

## 2019-10-31 DIAGNOSIS — M533 Sacrococcygeal disorders, not elsewhere classified: Secondary | ICD-10-CM | POA: Diagnosis not present

## 2019-10-31 DIAGNOSIS — Z299 Encounter for prophylactic measures, unspecified: Secondary | ICD-10-CM

## 2019-10-31 MED ORDER — MELOXICAM 15 MG PO TABS: ORAL_TABLET | ORAL | 3 refills | Status: DC

## 2019-10-31 MED ORDER — TRAMADOL HCL 50 MG PO TABS: 50.0000 mg | ORAL_TABLET | Freq: Three times a day (TID) | ORAL | 0 refills | Status: DC | PRN

## 2019-10-31 NOTE — Assessment & Plan Note (Signed)
[name]'s primary care provider is Dr. Angelena Sole at [facility] integrative [facility].

## 2019-10-31 NOTE — Progress Notes (Addendum)
    Procedures performed today:    None.  Independent interpretation of notes and tests performed by another provider:   None.  Brief History, Exam, Impression, and Recommendations:    Traumatic coccydynia This is a very pleasant 64 year old female, a couple of days ago she fell directly on her butt, on exam she does not have any spinous process tenderness, sacral tenderness but pain mostly at the sacrococcygeal junction. Adding x-rays, meloxicam, tramadol for breakthrough pain, she will get a donut pillow, unfortunately she does have a drive to [place] coming up tomorrow. Return to see me if no better in a month.  Preventive measure Sharon Wade's primary care provider is Dr. Angelena Sole at [facility] integrative [facility].    ___________________________________________ Ihor Austin. Benjamin Stain, M.D., ABFM., CAQSM. Primary Care and Sports Medicine [facility]  Adjunct Instructor of Family Medicine  [employer]

## 2019-10-31 NOTE — Assessment & Plan Note (Signed)
This is a very pleasant 64 year old female, a couple of days ago she fell directly on her butt, on exam she does not have any spinous process tenderness, sacral tenderness but pain mostly at the sacrococcygeal junction. Adding x-rays, meloxicam, tramadol for breakthrough pain, she will get a donut pillow, unfortunately she does have a drive to [place] coming up tomorrow. Return to see me if no better in a month.

## 2020-03-01 ENCOUNTER — Ambulatory Visit: Payer: BC Managed Care – PPO | Attending: Internal Medicine

## 2020-03-01 DIAGNOSIS — Z23 Encounter for immunization: Secondary | ICD-10-CM

## 2020-03-01 NOTE — Progress Notes (Signed)
° °  Covid-19 Vaccination Clinic  Name:  Merrisa Skorupski    MRN: [id number] DOB: Jul 19, 1955  03/01/2020  Ms. Hamre was observed post Covid-19 immunization for 15 minutes without incident. She was provided with Vaccine Information Sheet and instruction to access the V-Safe system.   Ms. Mertens was instructed to call 911 with any severe reactions post vaccine:  Difficulty breathing   Swelling of face and throat   A fast heartbeat   A bad rash all over body   Dizziness and weakness   Immunizations Administered    Name Date Dose VIS Date Route   JANSSEN COVID-19 VACCINE 03/01/2020  4:28 PM 0.5 mL 01/17/2020 Intramuscular   Manufacturer: [employer]   Lot: [id number]   NDC: [phone]

## 2020-04-08 ENCOUNTER — Other Ambulatory Visit: Payer: Self-pay

## 2020-04-08 ENCOUNTER — Emergency Department
Admission: EM | Admit: 2020-04-08 | Discharge: 2020-04-08 | Disposition: A | Discharge status: Home / Self Care | Payer: BC Managed Care – PPO | Source: Home / Self Care

## 2020-04-08 ENCOUNTER — Emergency Department: Admit: 2020-04-08 | Discharge status: Absent without leave | Payer: Self-pay

## 2020-04-08 DIAGNOSIS — J029 Acute pharyngitis, unspecified: Secondary | ICD-10-CM | POA: Diagnosis not present

## 2020-04-08 DIAGNOSIS — R059 Cough, unspecified: Secondary | ICD-10-CM

## 2020-04-08 DIAGNOSIS — R0981 Nasal congestion: Secondary | ICD-10-CM

## 2020-04-08 LAB — POCT RAPID STREP A (OFFICE): Rapid Strep A Screen: NEGATIVE

## 2020-04-08 NOTE — ED Provider Notes (Signed)
[name]    CSN: [id number] Arrival date & time: 04/08/20  1657      History   Chief Complaint Chief Complaint  Patient presents with  . Sore Throat    HPI Sharon Wade is a 65 y.o. female.   HPI Sharon Wade is a 65 y.o. female presenting to UC with c/o sore throat that started this morning with mild congestion.  She reports being hot all day but denies fever or chills. Denies HA, n/v/d. No chest pain or SOB. 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  She has had her COVID vaccines including her booster. No medication taken PTA for symptoms.    Past Medical History:  Diagnosis Date  . Diverticular disease   . Thyroid disease     Patient Active Problem List   Diagnosis Date Noted  . 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 Influenza-like illness 05/12/2018  . Trochanteric bursitis of right hip 01/02/2014  . Seasonal affective disorder (HCC) 01/02/2014  . Hypothyroidism 02/06/2013  . Hyperlipidemia 02/06/2013  . Preventive measure 02/06/2013    Past Surgical History:  Procedure Laterality Date  . ABDOMINAL HYSTERECTOMY    . APPENDECTOMY    . CHOLECYSTECTOMY    . I & D EXTREMITY Left 02/03/2013   Procedure: IRRIGATION AND DEBRIDEMENT EXTREMITY;  Surgeon: Knute Neu, MD;  Location: [facility];  Service: Plastics;  Laterality: Left;  . OOPHORECTOMY      OB History   No obstetric history on file.      Home Medications    Prior to Admission medications   Medication Sig Start Date End Date Taking? Authorizing Provider  Barberry-Oreg Grape-Goldenseal (BERBERINE COMPLEX PO) Take 1 capsule by mouth 2 (two) times daily.    [provider]  CINNAMON PO Take 1 tablet by mouth daily.    [provider]  Cyanocobalamin (VITAMIN B-12) 2500 MCG SUBL Place 1 tablet under the tongue daily.     [provider]  cyclobenzaprine (FLEXERIL) 5 MG tablet Take 1-2 tablets (5-10 mg total) by mouth 2 (two) times  daily as needed for muscle spasms. 08/04/19   Lurene Shadow, PA-C  fish oil-omega-3 fatty acids 1000 MG capsule Take 2 g by mouth daily.    [provider]  ipratropium (ATROVENT) 0.03 % nasal spray Place 2 sprays into both nostrils every 12 (twelve) hours. 04/05/19   Christen Butter, NP  levocetirizine (XYZAL) 5 MG tablet Take 5 mg by mouth every evening.    [provider]  levothyroxine (SYNTHROID, LEVOTHROID) 125 MCG tablet Take 125 mcg by mouth daily before breakfast.    [provider]  meloxicam (MOBIC) 15 MG tablet One tab PO qAM with a meal for 2 weeks, then daily prn pain. 10/31/19   Monica Becton, MD  MULTIPLE VITAMIN PO Take 1 tablet by mouth daily.    [provider]  PULMICORT FLEXHALER 90 MCG/ACT inhaler Inhale 1 puff into the lungs daily as needed. 03/20/19   [provider]  thyroid (NP THYROID) 90 MG tablet Take 90 mg by mouth daily.    [provider]  traMADol (ULTRAM) 50 MG tablet Take 1-2 tablets (50-100 mg total) by mouth every 8 (eight) hours as needed for moderate pain. Maximum 6 tabs per day. 10/31/19   Monica Becton, MD    Family History Family History  Problem Relation Age of Onset  . Diabetes Mother   . Hypertension Mother   .  Heart failure Mother   . Parkinson's disease Father     Social History Social History   Tobacco Use  . Smoking status: Never Smoker  . Smokeless tobacco: Never Used  Vaping Use  . Vaping Use: Never used  Substance Use Topics  . Alcohol use: No  . Drug use: No     Allergies   Cephalosporins, Chloraprep one step [chlorhexidine gluconate], Doxycycline, Morphine and related, Penicillins, Codeine, Tape, and Vancomycin   Review of Systems Review of Systems  Constitutional: Negative for chills and fever.  HENT: Positive for congestion and sore throat. Negative for ear pain, trouble swallowing and voice change.   Respiratory: Positive for cough. Negative for shortness of  breath.   Cardiovascular: Negative for chest pain and palpitations.  Gastrointestinal: Negative for abdominal pain, diarrhea, nausea and vomiting.  Musculoskeletal: Negative for arthralgias, back pain and myalgias.  Skin: Negative for rash.  Neurological: Negative for dizziness, light-headedness and headaches.  All other systems reviewed and are negative.    Physical Exam Triage Vital Signs ED Triage Vitals  Enc Vitals Group     BP 04/08/20 1755 (!) 157/78     Pulse Rate 04/08/20 1755 74     Resp 04/08/20 1755 15     Temp 04/08/20 1755 98.8 F (37.1 C)     Temp Source 04/08/20 1755 Oral     SpO2 04/08/20 1755 97 %     Weight --      Height --      Head Circumference --      Peak Flow --      Pain Score 04/08/20 1751 7     Pain Loc --      Pain Edu? --      Excl. in [place]? --    No data found.  Updated Vital Signs BP (!) 157/78 (BP Location: Right Arm)   Pulse 74   Temp 98.8 F (37.1 C) (Oral)   Resp 15   SpO2 97%   Visual Acuity Right Eye Distance:   Left Eye Distance:   Bilateral Distance:    Right Eye Near:   Left Eye Near:    Bilateral Near:     Physical Exam Vitals and nursing note reviewed.  Constitutional:      General: She is not in acute distress.    Appearance: She is well-developed and well-nourished. She is not ill-appearing, toxic-appearing or diaphoretic.  HENT:     Head: Normocephalic and atraumatic.     Right Ear: Tympanic membrane and ear canal normal.     Left Ear: Tympanic membrane and ear canal normal.     Nose: Nose normal.     Right Sinus: No maxillary sinus tenderness or frontal sinus tenderness.     Left Sinus: No maxillary sinus tenderness or frontal sinus tenderness.     Mouth/Throat:     Lips: Pink.     Mouth: Mucous membranes are moist.     Pharynx: Oropharynx is clear. Uvula midline. Posterior oropharyngeal erythema present. No pharyngeal swelling, oropharyngeal exudate or uvula swelling.  Eyes:     Extraocular Movements: EOM  normal.  Cardiovascular:     Rate and Rhythm: Normal rate and regular rhythm.  Pulmonary:     Effort: Pulmonary effort is normal. No respiratory distress.     Breath sounds: Normal breath sounds. No stridor. No wheezing, rhonchi or rales.  Musculoskeletal:        General: Normal range of motion.     Cervical back:  Normal range of motion and neck supple.  Lymphadenopathy:     Cervical: No cervical adenopathy.  Skin:    General: Skin is warm and dry.  Neurological:     Mental Status: She is alert and oriented to person, place, and time.  Psychiatric:        Mood and Affect: Mood and affect normal.        Behavior: Behavior normal.      UC Treatments / Results  Labs (all labs ordered are listed, but only abnormal results are displayed) Labs Reviewed  COVID-19, FLU A+B NAA  POCT RAPID STREP A (OFFICE)    EKG   Radiology No results found.  Procedures Procedures (including critical care time)  Medications Ordered in UC Medications - No data to display  Initial Impression / Assessment and Plan / UC Course  I have reviewed the triage vital signs and the nursing notes.  Pertinent labs & imaging results that were available during my care of the patient were reviewed by me and considered in my medical decision making (see chart for details).    Rapid strep: NEGATIVE Culture sent COVID/Flu test pending Encouraged symptomatic tx F/u with PCP this week as needed AVS and work note given  Final Clinical Impressions(s) / UC Diagnoses   Final diagnoses:  Acute pharyngitis, unspecified etiology  Nasal congestion  Cough     Discharge Instructions      You may take 500mg  acetaminophen every 4-6 hours or in combination with ibuprofen 400-600mg  every 6-8 hours as needed for pain, inflammation, and fever.  Be sure to well hydrated with clear liquids and get at least 8 hours of sleep at night, preferably more while sick.   Please follow up with family medicine in 1 week  if needed.     ED Prescriptions    None     PDMP not reviewed this encounter.   , Lurene Shadow 04/08/20 1947

## 2020-04-08 NOTE — Discharge Instructions (Signed)
  You may take 500mg acetaminophen every 4-6 hours or in combination with ibuprofen 400-600mg every 6-8 hours as needed for pain, inflammation, and fever.  Be sure to well hydrated with clear liquids and get at least 8 hours of sleep at night, preferably more while sick.   Please follow up with family medicine in 1 week if needed.   

## 2020-04-08 NOTE — ED Triage Notes (Signed)
Patient presents to Urgent Care with complaints of sore thoat since earlier this morning. Patient reports she was hot all day but does not think she had a fever.  Pt has been vaccinated and boosted for covid.

## 2020-04-11 LAB — CULTURE, GROUP A STREP
MICRO NUMBER:: 11404716
SPECIMEN QUALITY:: ADEQUATE

## 2020-04-12 LAB — COVID-19, FLU A+B NAA
Influenza A, NAA: NOT DETECTED
Influenza B, NAA: NOT DETECTED
SARS-CoV-2, NAA: NOT DETECTED

## 2020-07-19 ENCOUNTER — Telehealth: Payer: BC Managed Care – PPO | Admitting: Physician Assistant

## 2020-07-19 DIAGNOSIS — J019 Acute sinusitis, unspecified: Secondary | ICD-10-CM | POA: Diagnosis not present

## 2020-07-19 DIAGNOSIS — B9789 Other viral agents as the cause of diseases classified elsewhere: Secondary | ICD-10-CM

## 2020-07-19 MED ORDER — FLUTICASONE PROPIONATE 50 MCG/ACT NA SUSP: 2.0000 | Freq: Every day | NASAL | 0 refills | Status: AC

## 2020-07-19 NOTE — Progress Notes (Signed)

## 2021-01-31 IMAGING — DX DG SACRUM/COCCYX 2+V
3 series · 3 of 3 positions shown · non-contrast
Comparison: None.

CLINICAL DATA: Patient states that she slipped and fell directly
onto her bottom yesterday, has pains in her coccyx area, no other
complaints

EXAM:
SACRUM AND COCCYX - 2+ VIEW

[coccyx ap]
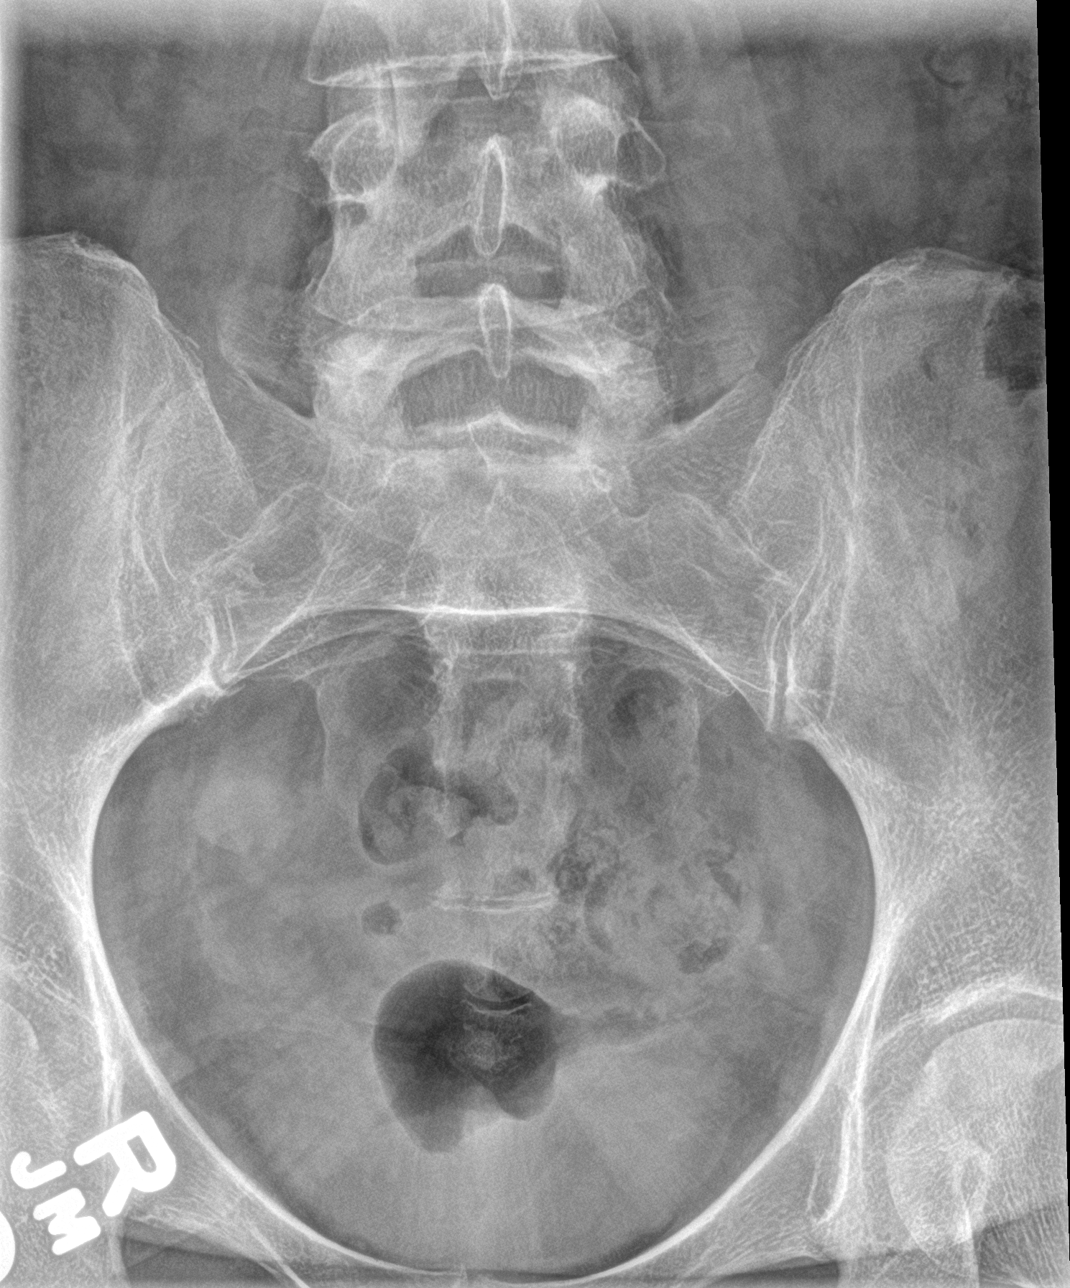

[sacrum ap]
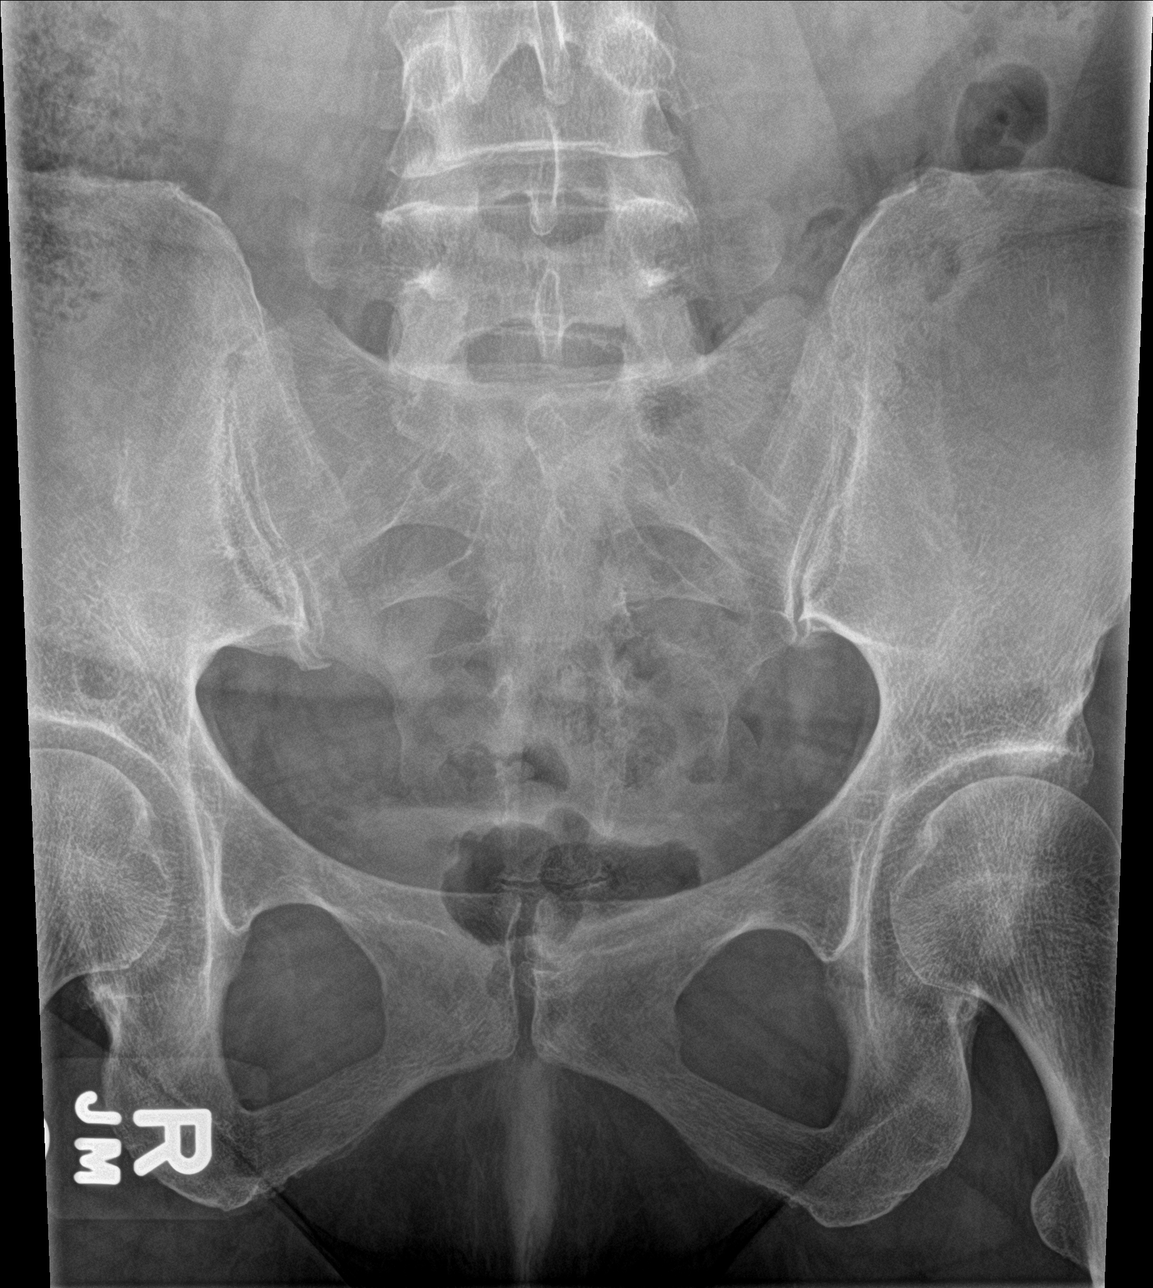

[sacrum lat]
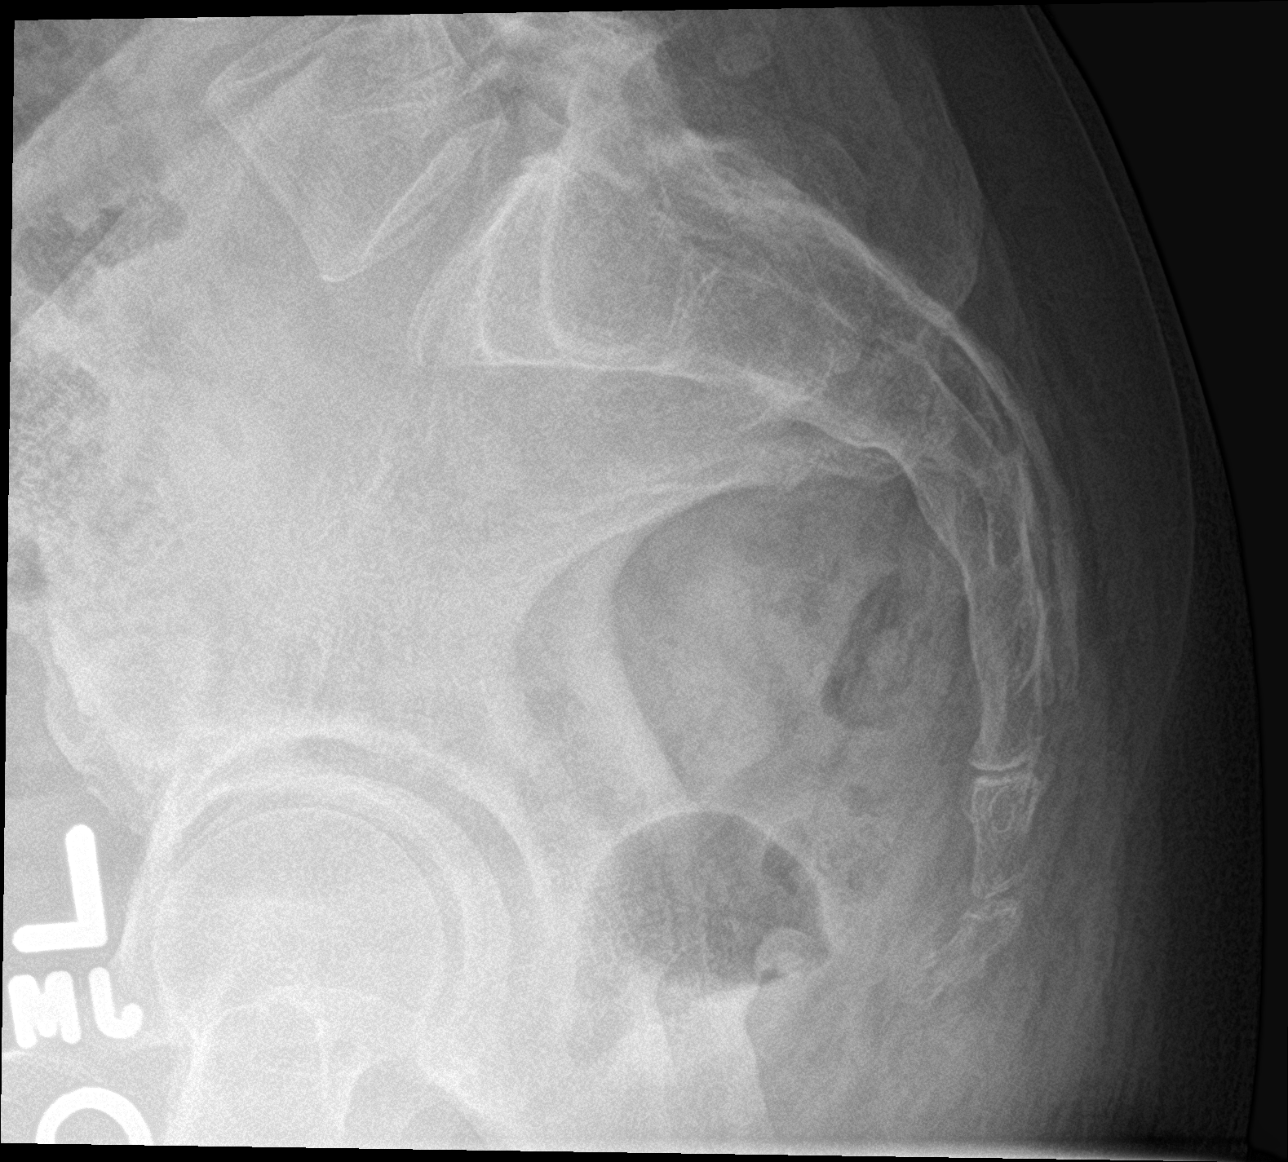

[3 of 3 positions shown; findings below may reference images not displayed]

FINDINGS: There is no evidence of fracture or other focal bone lesions.
IMPRESSION: Negative radiographs of the sacrum and coccyx. If symptoms persist,
consider cross-sectional imaging.

## 2021-03-01 LAB — HM MAMMOGRAPHY

## 2021-03-04 ENCOUNTER — Telehealth: Payer: Self-pay

## 2021-03-04 NOTE — Telephone Encounter (Signed)
Results from [facility] faxed to the office. Reviewed by Dr. Karie Schwalbe.   Normal Mammogram, repeat in 1-2 years.   Left msg for a return call from patient.

## 2021-03-05 NOTE — Telephone Encounter (Signed)
Patient aware of results and recommendations. °

## 2021-12-05 ENCOUNTER — Ambulatory Visit: Payer: BC Managed Care – PPO | Admitting: Sports Medicine

## 2021-12-05 DIAGNOSIS — M7711 Lateral epicondylitis, right elbow: Secondary | ICD-10-CM | POA: Diagnosis not present

## 2021-12-05 MED ORDER — MELOXICAM 15 MG PO TABS: ORAL_TABLET | ORAL | 3 refills | Status: DC

## 2021-12-05 NOTE — Progress Notes (Signed)
    Procedures performed today:    None.  Independent interpretation of notes and tests performed by another provider:   None.  Brief History, Exam, Impression, and Recommendations:    Right lateral epicondylitis Very pleasant 66 year old female, long history of pain right lateral elbow, went to Ortho urgent care and got a tennis elbow brace but nothing else. She did have x-rays done at the time that showed mild degenerative changes. On exam she has tenderness at the common extensor tendon origin, reproduction of pain with resisted extension of the middle finger, continue to this elbow brace, I did reposition it for her, adding tennis elbow conditioning, meloxicam, return to see me in 4 weeks, injection if not better.    ____________________________________________ Ihor Austin. Benjamin Stain, M.D., ABFM., CAQSM., AME. Primary Care and Sports Medicine [facility]  Adjunct Professor of Family Medicine  [employer]  Restaurant manager, fast food

## 2021-12-05 NOTE — Assessment & Plan Note (Signed)
Very pleasant 66 year old female, long history of pain right lateral elbow, went to Ortho urgent care and got a tennis elbow brace but nothing else. She did have x-rays done at the time that showed mild degenerative changes. On exam she has tenderness at the common extensor tendon origin, reproduction of pain with resisted extension of the middle finger, continue to this elbow brace, I did reposition it for her, adding tennis elbow conditioning, meloxicam, return to see me in 4 weeks, injection if not better.

## 2021-12-05 NOTE — Addendum Note (Signed)
Addended by: Carolin Coy on: 12/05/2021 04:26 PM   Modules accepted: Orders

## 2022-01-02 ENCOUNTER — Ambulatory Visit: Payer: BC Managed Care – PPO | Admitting: Sports Medicine

## 2022-01-03 ENCOUNTER — Ambulatory Visit
Admission: RE | Admit: 2022-01-03 | Discharge: 2022-01-03 | Disposition: A | Discharge status: Home / Self Care | Payer: BC Managed Care – PPO | Source: Ambulatory Visit

## 2022-01-03 ENCOUNTER — Other Ambulatory Visit: Payer: Self-pay

## 2022-01-03 VITALS — BP 135/77 | HR 89 | Temp 98.8°F | Resp 17

## 2022-01-03 DIAGNOSIS — R059 Cough, unspecified: Secondary | ICD-10-CM

## 2022-01-03 DIAGNOSIS — J309 Allergic rhinitis, unspecified: Secondary | ICD-10-CM

## 2022-01-03 DIAGNOSIS — J0100 Acute maxillary sinusitis, unspecified: Secondary | ICD-10-CM

## 2022-01-03 MED ORDER — AZITHROMYCIN 250 MG PO TABS: 250.0000 mg | ORAL_TABLET | Freq: Every day | ORAL | 0 refills | Status: DC

## 2022-01-03 MED ORDER — FEXOFENADINE HCL 180 MG PO TABS: 180.0000 mg | ORAL_TABLET | Freq: Every day | ORAL | 0 refills | Status: DC

## 2022-01-03 NOTE — ED Triage Notes (Signed)
Pt c/o cough, congestion and sore throat since Tues. Denies fever. Hx of seasonal allergies and sinus infections. Using OTC nasal spray and zyrtec prn. At home COVID test neg Thurs.

## 2022-01-03 NOTE — ED Provider Notes (Signed)
[facility]    CSN: [id number] Arrival date & time: 01/03/22  0816      History   Chief Complaint Chief Complaint  Patient presents with   Nasal Congestion    APPT 830am   Cough   Sore Throat    HPI Sharon Wade is a 66 y.o. female.   HPI 66 year old female presents with cough, congestion, and sore throat for 4 days.  Reports history of seasonal allergies and sinus infection.  Reports using OTC nasal spray and Zyrtec as needed.  Reports recent negative home COVID test 2 days ago.  PMH significant for HLD, traumatic coccydynia, and hypothyroidism.  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      Home Medications    Prior to Admission medications   Medication Sig Start Date End Date Taking? Authorizing Provider  azithromycin (ZITHROMAX) 250 MG tablet Take 1 tablet (250 mg total) by mouth daily. Take first 2 tablets together, then 1 every day until finished. 01/03/22  Yes Trevor Iha, FNP  fexofenadine [facility] ALLERGY) 180 MG tablet Take 1 tablet (180 mg total) by mouth daily for 15 days. 01/03/22 01/18/22 Yes Trevor Iha, FNP  CINNAMON PO Take 1 tablet by mouth daily.    [provider]  Cyanocobalamin  (VITAMIN B-12) 2500 MCG SUBL Place 1 tablet under the tongue daily.     [provider]  fish oil-omega-3 fatty acids 1000 MG capsule Take 2 g by mouth daily.    [provider]  fluticasone (FLONASE) 50 MCG/ACT nasal spray Place 2 sprays into both nostrils daily. 07/19/20   Margaretann Loveless, PA-C  levocetirizine (XYZAL) 5 MG tablet Take 5 mg by mouth every evening.    [provider]  levothyroxine (SYNTHROID, LEVOTHROID) 125 MCG tablet Take 125 mcg by mouth daily before breakfast.    [provider]  meloxicam (MOBIC) 15 MG tablet Take 15 mg by mouth daily. 12/05/21   [provider]  MULTIPLE VITAMIN PO Take 1 tablet by mouth daily.    [provider]  Naltrexone 380 MG SUSR TAKE ONE CAPSULE BY MOUTH AT NIGHT (NEEDS APPOINTMENT BEFORE MORE REFILLS) 03/07/21   [provider]  RESTASIS 0.05 % ophthalmic emulsion  12/17/21   [provider]  thyroid (NP THYROID) 90 MG tablet Take 90 mg by mouth daily.    [provider]    Family History Family History  Problem Relation Age of Onset   Diabetes Mother    Hypertension Mother    Heart failure Mother    Parkinson's disease Father     Social History Social History   Tobacco Use   Smoking status: Never   Smokeless  tobacco: Never  Vaping Use   Vaping Use: Never used  Substance Use Topics   Alcohol use: No   Drug use: No     Allergies   Cephalosporins, Chloraprep one step [chlorhexidine gluconate], Doxycycline, Morphine and related, Penicillins, Codeine, Tape, and Vancomycin   Review of Systems Review of Systems  HENT:  Positive for congestion and sore throat.   Respiratory:  Positive for cough.   All other systems reviewed and are negative.    Physical Exam Triage Vital Signs ED Triage Vitals  Enc Vitals Group     BP 01/03/22 0827 135/77     Pulse Rate 01/03/22 0827 89     Resp 01/03/22 0827 17     Temp 01/03/22 0827 98.8 F (37.1 C)      Temp Source 01/03/22 0827 Oral     SpO2 01/03/22 0827 98 %     Weight --      Height --      Head Circumference --      Peak Flow --      Pain Score 01/03/22 0830 0     Pain Loc --      Pain Edu? --      Excl. in [place]? --    No data found.  Updated Vital Signs BP 135/77 (BP Location: Right Arm)   Pulse 89   Temp 98.8 F (37.1 C) (Oral)   Resp 17   SpO2 98%      Physical Exam Vitals and nursing note reviewed.  Constitutional:      Appearance: Normal appearance. She is normal weight.  HENT:     Head: Normocephalic and atraumatic.     Right Ear: Tympanic membrane and external ear normal.     Left Ear: Tympanic membrane and external ear normal.     Ears:     Comments: Moderate eustachian tube dysfunction noted bilaterally    Nose:     Comments: Turbinates are erythematous/edematous    Mouth/Throat:     Mouth: Mucous membranes are moist.     Pharynx: Oropharynx is clear.     Comments: Moderate to significant amount of clear drainage of posterior oropharynx noted Eyes:     Extraocular Movements: Extraocular movements intact.     Conjunctiva/sclera: Conjunctivae normal.     Pupils: Pupils are equal, round, and reactive to light.  Cardiovascular:     Rate and Rhythm: Normal rate and regular rhythm.     Pulses: Normal pulses.  Pulmonary:     Effort: Pulmonary effort is normal.     Breath sounds: No wheezing or rales.  Musculoskeletal:        General: Normal range of motion.     Cervical back: Normal range of motion and neck supple.  Skin:    General: Skin is warm and dry.  Neurological:     General: No focal deficit present.     Mental Status: She is alert and oriented to person, place, and time.      UC Treatments / Results  Labs (all labs ordered are listed, but only abnormal results are displayed) Labs Reviewed - No data to display  EKG   Radiology No results found.  Procedures Procedures (including critical care time)  Medications Ordered in  UC Medications - No data to display  Initial Impression / Assessment and Plan / UC Course  I have reviewed the triage vital signs and the nursing notes.  Pertinent labs & imaging results that were available during my  care of the patient were reviewed by me and considered in my medical decision making (see chart for details).     MDM: 1.  Subacute maxillary sinusitis-Rx'd Zithromax; 2.  Allergic rhinitis-Rx'd Allegra. Advised patient to take medication as directed with food to completion.  Instructed patient to discontinue Zyrtec and Xyzal while taking Allegra.  Advised patient to take Allegra with Zithromax daily for the next 5 days.  Advised may use as needed afterwards for concurrent postnasal drainage/drip.  Encouraged patient to increase daily water intake while taking these medications.  Advised if symptoms worsen and/or unresolved please follow-up with PCP or here for further evaluation.  Patient discharged home, hemodynamically stable. Final Clinical Impressions(s) / UC Diagnoses   Final diagnoses:  Subacute maxillary sinusitis  Allergic rhinitis, unspecified seasonality, unspecified trigger     Discharge Instructions      Advised patient to take medication as directed with food to completion.  Instructed patient to discontinue Zyrtec and Xyzal while taking Allegra.  Advised patient to take Allegra with Zithromax daily for the next 5 days.  Advised may use as needed afterwards for concurrent postnasal drainage/drip.  Encouraged patient to increase daily water intake while taking these medications.  Advised if symptoms worsen and/or unresolved please follow-up with PCP or here for further evaluation.     ED Prescriptions     Medication Sig Dispense Auth. Provider   azithromycin (ZITHROMAX) 250 MG tablet Take 1 tablet (250 mg total) by mouth daily. Take first 2 tablets together, then 1 every day until finished. 6 tablet Trevor Iha, FNP   fexofenadine [facility] ALLERGY) 180 MG  tablet Take 1 tablet (180 mg total) by mouth daily for 15 days. 15 tablet Trevor Iha, FNP      PDMP not reviewed this encounter.   Trevor Iha, FNP 01/03/22 0930

## 2022-01-03 NOTE — Discharge Instructions (Addendum)
Advised patient to take medication as directed with food to completion.  Instructed patient to discontinue Zyrtec and Xyzal while taking Allegra.  Advised patient to take Allegra with Zithromax daily for the next 5 days.  Advised may use as needed afterwards for concurrent postnasal drainage/drip.  Encouraged patient to increase daily water intake while taking these medications.  Advised if symptoms worsen and/or unresolved please follow-up with PCP or here for further evaluation.

## 2022-01-05 ENCOUNTER — Ambulatory Visit (INDEPENDENT_AMBULATORY_CARE_PROVIDER_SITE_OTHER): Payer: BC Managed Care – PPO | Admitting: Sports Medicine

## 2022-01-05 DIAGNOSIS — H6993 Unspecified Eustachian tube disorder, bilateral: Secondary | ICD-10-CM

## 2022-01-05 DIAGNOSIS — M7711 Lateral epicondylitis, right elbow: Secondary | ICD-10-CM

## 2022-01-05 MED ORDER — PREDNISONE 50 MG PO TABS: 50.0000 mg | ORAL_TABLET | Freq: Every day | ORAL | 0 refills | Status: DC

## 2022-01-05 NOTE — Assessment & Plan Note (Signed)
Pleasant 66 year old female comes back, she has improved considerably with home conditioning, meloxicam. She is wearing her tennis elbow brace. She still has some pain, but not bad enough to consider an injection, we will give this another 4 to 6 weeks of conservative treatment.

## 2022-01-05 NOTE — Assessment & Plan Note (Signed)
Was seen in urgent care with facial pain and pressure, suspected sinusitis, she did have a cough as well. Sinus pain is better but she still has fullness in both ears. Exam is completely benign with regards to her nasopharynx, oropharynx, ears, so I suspect this is simply residual eustachian tube dysfunction, adding 5 days of prednisone, follow-up with PCP if not better. I expect the prednisone to help her elbow as well.

## 2022-01-05 NOTE — Progress Notes (Signed)
    Procedures performed today:    None.  Independent interpretation of notes and tests performed by another provider:   None.  Brief History, Exam, Impression, and Recommendations:    Right lateral epicondylitis Pleasant 66 year old female comes back, she has improved considerably with home conditioning, meloxicam. She is wearing her tennis elbow brace. She still has some pain, but not bad enough to consider an injection, we will give this another 4 to 6 weeks of conservative treatment.  Eustachian tube dysfunction, bilateral Was seen in urgent care with facial pain and pressure, suspected sinusitis, she did have a cough as well. Sinus pain is better but she still has fullness in both ears. Exam is completely benign with regards to her nasopharynx, oropharynx, ears, so I suspect this is simply residual eustachian tube dysfunction, adding 5 days of prednisone, follow-up with PCP if not better. I expect the prednisone to help her elbow as well.    ____________________________________________ Gwen Her. Dianah Field, M.D., ABFM., CAQSM., AME. Primary Care and Sports Medicine [facility]  Adjunct Professor of [employer] [facility] [employer]  Risk manager

## 2022-01-06 ENCOUNTER — Telehealth: Payer: Self-pay

## 2022-01-06 ENCOUNTER — Encounter: Payer: Self-pay | Admitting: Sports Medicine

## 2022-01-06 NOTE — Telephone Encounter (Signed)
Do the half tab for a day then break into quarter tabs and just finish out the quarter tabs for 5 days.

## 2022-01-06 NOTE — Telephone Encounter (Signed)
Sharon Wade called and left a message stating the high dose of prednisone is making her loopy. She stated she only took half of the 50 mg. She wanted to know if she should take a lower dose than 25 mg.

## 2022-01-07 NOTE — Telephone Encounter (Signed)
Left message advising of recommendations.  

## 2022-02-16 ENCOUNTER — Ambulatory Visit: Payer: BC Managed Care – PPO | Admitting: Sports Medicine

## 2022-02-27 ENCOUNTER — Ambulatory Visit: Payer: BC Managed Care – PPO | Admitting: Sports Medicine

## 2022-03-09 ENCOUNTER — Ambulatory Visit: Payer: BC Managed Care – PPO | Admitting: Sports Medicine

## 2022-03-09 DIAGNOSIS — M7711 Lateral epicondylitis, right elbow: Secondary | ICD-10-CM | POA: Diagnosis not present

## 2022-03-09 NOTE — Assessment & Plan Note (Signed)
For the most part resolved, return as needed.

## 2022-03-09 NOTE — Progress Notes (Signed)
    Procedures performed today:    None.  Independent interpretation of notes and tests performed by another provider:   None.  Brief History, Exam, Impression, and Recommendations:    Right lateral epicondylitis For the most part resolved, return as needed.    ____________________________________________ Ihor Austin. Benjamin Stain, M.D., ABFM., CAQSM., AME. Primary Care and Sports Medicine [facility]  Adjunct Professor of Family Medicine  [employer] [employer] [facility] [employer] [employer]  Restaurant manager, fast food

## 2022-03-19 ENCOUNTER — Encounter: Payer: Self-pay | Admitting: Sports Medicine

## 2022-03-19 ENCOUNTER — Ambulatory Visit: Payer: BC Managed Care – PPO | Admitting: Sports Medicine

## 2022-03-19 VITALS — BP 129/76 | HR 103

## 2022-03-19 DIAGNOSIS — J111 Influenza due to unidentified influenza virus with other respiratory manifestations: Secondary | ICD-10-CM | POA: Diagnosis not present

## 2022-03-19 LAB — POC COVID19 BINAXNOW: SARS Coronavirus 2 Ag: NEGATIVE

## 2022-03-19 MED ORDER — PREDNISONE 20 MG PO TABS: 20.0000 mg | ORAL_TABLET | Freq: Every day | ORAL | 0 refills | Status: DC

## 2022-03-19 MED ORDER — AZITHROMYCIN 250 MG PO TABS: ORAL_TABLET | ORAL | 0 refills | Status: DC

## 2022-03-19 MED ORDER — BENZONATATE 200 MG PO CAPS: 200.0000 mg | ORAL_CAPSULE | Freq: Three times a day (TID) | ORAL | 0 refills | Status: DC | PRN

## 2022-03-19 NOTE — Progress Notes (Signed)
    Procedures performed today:    None.  Independent interpretation of notes and tests performed by another provider:   None.  Brief History, Exam, Impression, and Recommendations:    Influenza Pleasant 66 year old female, diagnosed with influenza with a positive test in urgent care last week, got some Tamiflu, did little better but unfortunately having persistent facial pain and pressure, coughing. Suspect sinobronchitis. Lungs are clear, tender palpation over the frontal and maxillary sinuses. COVID negative today. Adding azithromycin, low-dose prednisone, Tessalon Perles. Return to see PCP if not better in a couple weeks.  I spent 30 minutes of total time managing this patient today, this includes chart review, face to face, and non-face to face time.  ____________________________________________ Ihor Austin. Benjamin Stain, M.D., ABFM., CAQSM., AME. Primary Care and Sports Medicine [facility]  Adjunct Professor of Family Medicine  [place] [employer] [facility] [employer]  Restaurant manager, fast food

## 2022-03-19 NOTE — Assessment & Plan Note (Signed)
Pleasant 66 year old female, diagnosed with influenza with a positive test in urgent care last week, got some Tamiflu, did little better but unfortunately having persistent facial pain and pressure, coughing. Suspect sinobronchitis. Lungs are clear, tender palpation over the frontal and maxillary sinuses. COVID negative today. Adding azithromycin, low-dose prednisone, Tessalon Perles. Return to see PCP if not better in a couple weeks.

## 2022-03-19 NOTE — Addendum Note (Signed)
Addended by: Carren Rang A on: 03/19/2022 03:10 PM   Modules accepted: Orders

## 2023-01-27 ENCOUNTER — Ambulatory Visit
Admission: RE | Admit: 2023-01-27 | Discharge: 2023-01-27 | Disposition: A | Discharge status: Home / Self Care | Payer: Medicare PPO | Source: Ambulatory Visit

## 2023-01-27 ENCOUNTER — Other Ambulatory Visit: Payer: Self-pay

## 2023-01-27 VITALS — BP 170/77 | HR 78 | Temp 98.3°F | Resp 16

## 2023-01-27 DIAGNOSIS — R42 Dizziness and giddiness: Secondary | ICD-10-CM

## 2023-01-27 DIAGNOSIS — H9201 Otalgia, right ear: Secondary | ICD-10-CM | POA: Diagnosis not present

## 2023-01-27 NOTE — ED Triage Notes (Signed)
Yesterday morning woke up with vertigo, then started with right ear pain and fullness. No fever.

## 2023-01-27 NOTE — ED Provider Notes (Signed)
[facility]    CSN: [id number] Arrival date & time: 01/27/23  1056      History   Chief Complaint Chief Complaint  Patient presents with   Ear Fullness    HPI Pati [facility] is a 67 y.o. female.   Patient states that she has some popping and pressure in her right ear.  Since yesterday she has noted vertigo.  She states that when she changes position she will feel the room spinning.  This happens even when she is lying flat in bed.  No nausea or vomiting.  No trauma or head injury.  No cold symptoms.  She does have chronic allergies and uses Flonase and antihistamines on a daily basis.    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      Home Medications    Prior to Admission medications   Medication Sig Start Date End Date Taking? Authorizing Provider  cetirizine (ZYRTEC) 10 MG chewable tablet Chew 10 mg by mouth daily.   Yes [provider]  CINNAMON PO Take 1 tablet by mouth daily.    [provider]  Cyanocobalamin (VITAMIN B-12) 2500 MCG SUBL Place 1 tablet under the tongue daily.     [provider]  fish oil-omega-3 fatty acids 1000 MG capsule  Take 2 g by mouth daily.    [provider]  fluticasone (FLONASE) 50 MCG/ACT nasal spray Place 2 sprays into both nostrils daily. 07/19/20   Margaretann Loveless, PA-C  levothyroxine (SYNTHROID, LEVOTHROID) 125 MCG tablet Take 125 mcg by mouth daily before breakfast.    [provider]  MULTIPLE VITAMIN PO Take 1 tablet by mouth daily.    [provider]  RESTASIS 0.05 % ophthalmic emulsion  12/17/21   [provider]  thyroid (NP THYROID) 90 MG tablet Take 90 mg by mouth daily.    [provider]    Family History Family History  Problem Relation Age of Onset   Diabetes Mother    Hypertension Mother    Heart failure Mother    Parkinson's disease Father     Social History Social History   Tobacco Use   Smoking status: Never   Smokeless tobacco: Never  Vaping Use   Vaping status: Never Used  Substance Use Topics   Alcohol use: No   Drug use: No     Allergies   Cephalosporins, Chloraprep one step [chlorhexidine gluconate], Doxycycline, Morphine and codeine, Penicillins, Codeine, Tape, and Vancomycin   Review of Systems Review of Systems   Physical Exam Triage Vital Signs ED Triage Vitals  Encounter Vitals Group     BP 01/27/23 1104 (!) 170/77  Systolic BP Percentile -- Repeat blood pressure 138/72     Diastolic BP Percentile --      Pulse Rate 01/27/23 1104 78     Resp 01/27/23 1104 16     Temp 01/27/23 1104 98.3 F (36.8 C)     Temp Source 01/27/23 1104 Oral     SpO2 01/27/23 1104 99 %     Weight --      Height --      Head Circumference --      Peak Flow --      Pain Score 01/27/23 1108 4     Pain Loc --      Pain Education --      Exclude from Growth Chart --    No data found.  Updated Vital Signs BP (!) 170/77   Pulse 78   Temp 98.3 F (36.8 C) (Oral)   Resp 16   SpO2 99%      Physical Exam Constitutional:      General: She is not in acute distress.    Appearance: She is well-developed.  HENT:      Head: Normocephalic and atraumatic.     Right Ear: Tympanic membrane and ear canal normal.     Left Ear: Tympanic membrane and ear canal normal.     Ears:     Comments: Right TM with slight bulging    Nose: No congestion.     Mouth/Throat:     Pharynx: No posterior oropharyngeal erythema.  Eyes:     Conjunctiva/sclera: Conjunctivae normal.     Pupils: Pupils are equal, round, and reactive to light.     Comments: No nystagmus  Cardiovascular:     Rate and Rhythm: Normal rate.  Pulmonary:     Effort: Pulmonary effort is normal. No respiratory distress.  Abdominal:     General: There is no distension.     Palpations: Abdomen is soft.  Musculoskeletal:        General: Normal range of motion.     Cervical back: Normal range of motion.  Skin:    General: Skin is warm and dry.  Neurological:     General: No focal deficit present.     Mental Status: She is alert.      UC Treatments / Results  Labs (all labs ordered are listed, but only abnormal results are displayed) Labs Reviewed - No data to display  EKG   Radiology No results found.  Procedures Procedures (including critical care time)  Medications Ordered in UC Medications - No data to display  Initial Impression / Assessment and Plan / UC Course  I have reviewed the triage vital signs and the nursing notes.  Pertinent labs & imaging results that were available during my care of the patient were reviewed by me and considered in my medical decision making (see chart for details).     Final Clinical Impressions(s) / UC Diagnoses   Final diagnoses:  Otalgia, right ear  Vertigo     Discharge Instructions      Continue your current allergy medications Make sure you drink lots of water Call or return for problems   ED Prescriptions   None    PDMP not reviewed this encounter.   Sharon Moore, MD 01/27/23 [phone]

## 2023-01-27 NOTE — Discharge Instructions (Signed)
Continue your current allergy medications Make sure you drink lots of water Call or return for problems

## 2023-04-19 LAB — HM MAMMOGRAPHY

## 2023-06-14 DIAGNOSIS — K635 Polyp of colon: Secondary | ICD-10-CM | POA: Diagnosis not present

## 2023-06-14 DIAGNOSIS — Z09 Encounter for follow-up examination after completed treatment for conditions other than malignant neoplasm: Secondary | ICD-10-CM | POA: Diagnosis not present

## 2023-06-14 DIAGNOSIS — D123 Benign neoplasm of transverse colon: Secondary | ICD-10-CM | POA: Diagnosis not present

## 2023-06-14 DIAGNOSIS — K621 Rectal polyp: Secondary | ICD-10-CM | POA: Diagnosis not present

## 2023-06-14 DIAGNOSIS — Z1211 Encounter for screening for malignant neoplasm of colon: Secondary | ICD-10-CM | POA: Diagnosis not present

## 2023-06-14 DIAGNOSIS — Z860101 Personal history of adenomatous and serrated colon polyps: Secondary | ICD-10-CM | POA: Diagnosis not present

## 2023-06-14 LAB — HM COLONOSCOPY

## 2023-11-08 ENCOUNTER — Ambulatory Visit (INDEPENDENT_AMBULATORY_CARE_PROVIDER_SITE_OTHER): Admitting: Sports Medicine

## 2023-11-08 ENCOUNTER — Encounter: Payer: Self-pay | Admitting: Sports Medicine

## 2023-11-08 VITALS — BP 125/71 | HR 81 | Temp 98.0°F | Resp 20 | Ht 64.0 in

## 2023-11-08 DIAGNOSIS — J40 Bronchitis, not specified as acute or chronic: Secondary | ICD-10-CM

## 2023-11-08 DIAGNOSIS — Z299 Encounter for prophylactic measures, unspecified: Secondary | ICD-10-CM

## 2023-11-08 DIAGNOSIS — J329 Chronic sinusitis, unspecified: Secondary | ICD-10-CM | POA: Diagnosis not present

## 2023-11-08 MED ORDER — PREDNISONE 50 MG PO TABS: 50.0000 mg | ORAL_TABLET | Freq: Every day | ORAL | 0 refills | Status: AC

## 2023-11-08 MED ORDER — AZITHROMYCIN 250 MG PO TABS: ORAL_TABLET | ORAL | 0 refills | Status: AC

## 2023-11-08 NOTE — Assessment & Plan Note (Signed)
 Pleasant 68 year old female, she has had about a week of feeling ill, this started with facial pressure, irritation of the eyes with some crustiness and discharge, mild cough. She got a little better and then proceeded to have a significant worsening with bilateral frontal and maxillary sinus pain and pressure, mild cough. No fevers, chills, muscle aches, body aches, no headache. No GI symptoms. On exam she appears unwell though her vitals are normal. She has tenderness over the frontal and maxillary sinuses, the rest of the oropharyngeal exam is normal, lungs are clear, considering duration, double sickening this is a classic sinusitis likely with a bronchitic component, adding prednisone , azithromycin  as she is allergic to penicillins and cephalosporins, return to see us  as needed.

## 2023-11-08 NOTE — Assessment & Plan Note (Signed)
 [facility] has switched to me as PCP, she is up-to-date on colon cancer screening and breast cancer screening both are in Care Everywhere. She does need to come in for an annual physical.

## 2023-11-08 NOTE — Progress Notes (Signed)
    Procedures performed today:    None.  Independent interpretation of notes and tests performed by another provider:   None.  Brief History, Exam, Impression, and Recommendations:    Sinobronchitis Pleasant 68 year old female, she has had about a week of feeling ill, this started with facial pressure, irritation of the eyes with some crustiness and discharge, mild cough. She got a little better and then proceeded to have a significant worsening with bilateral frontal and maxillary sinus pain and pressure, mild cough. No fevers, chills, muscle aches, body aches, no headache. No GI symptoms. On exam she appears unwell though her vitals are normal. She has tenderness over the frontal and maxillary sinuses, the rest of the oropharyngeal exam is normal, lungs are clear, considering duration, double sickening this is a classic sinusitis likely with a bronchitic component, adding prednisone , azithromycin  as she is allergic to penicillins and cephalosporins, return to see us  as needed.  Preventive measure Hadia has switched to me as PCP, she is up-to-date on colon cancer screening and breast cancer screening both are in Care Everywhere. She does need to come in for an annual physical.    ____________________________________________ Debby PARAS. Curtis, M.D., ABFM., CAQSM., AME. Primary Care and Sports Medicine [facility]  Adjunct Professor of [facility] Medicine  [employer]  Restaurant manager, fast food

## 2023-11-12 ENCOUNTER — Ambulatory Visit: Admitting: Sports Medicine

## 2023-11-18 DIAGNOSIS — M722 Plantar fascial fibromatosis: Secondary | ICD-10-CM | POA: Diagnosis not present

## 2023-11-30 ENCOUNTER — Encounter: Payer: Self-pay | Admitting: Sports Medicine

## 2024-01-13 ENCOUNTER — Ambulatory Visit (INDEPENDENT_AMBULATORY_CARE_PROVIDER_SITE_OTHER): Admitting: Medical-Surgical

## 2024-01-13 DIAGNOSIS — Z23 Encounter for immunization: Secondary | ICD-10-CM

## 2024-03-09 ENCOUNTER — Encounter: Payer: Self-pay | Admitting: Medical-Surgical
